# Patient Record
Sex: Female | Born: 1950
Health system: Southern US, Community
[De-identification: ages and names within clinical notes are randomized; demographics above are authoritative.]

## PROBLEM LIST (undated history)

## (undated) ENCOUNTER — Ambulatory Visit: Payer: Medicare Other | Source: Home / Self Care

## (undated) DIAGNOSIS — J309 Allergic rhinitis, unspecified: Secondary | ICD-10-CM

## (undated) DIAGNOSIS — E119 Type 2 diabetes mellitus without complications: Secondary | ICD-10-CM

## (undated) DIAGNOSIS — E785 Hyperlipidemia, unspecified: Secondary | ICD-10-CM

## (undated) DIAGNOSIS — R011 Cardiac murmur, unspecified: Secondary | ICD-10-CM

## (undated) DIAGNOSIS — F329 Major depressive disorder, single episode, unspecified: Secondary | ICD-10-CM

## (undated) DIAGNOSIS — R413 Other amnesia: Secondary | ICD-10-CM

## (undated) DIAGNOSIS — F32A Depression, unspecified: Secondary | ICD-10-CM

## (undated) DIAGNOSIS — J939 Pneumothorax, unspecified: Secondary | ICD-10-CM

## (undated) DIAGNOSIS — K219 Gastro-esophageal reflux disease without esophagitis: Secondary | ICD-10-CM

## (undated) DIAGNOSIS — E669 Obesity, unspecified: Secondary | ICD-10-CM

## (undated) DIAGNOSIS — I10 Essential (primary) hypertension: Secondary | ICD-10-CM

## (undated) DIAGNOSIS — J329 Chronic sinusitis, unspecified: Secondary | ICD-10-CM

## (undated) HISTORY — PX: LEG SURGERY: SHX1003

## (undated) HISTORY — DX: Chronic sinusitis, unspecified: J32.9

## (undated) HISTORY — DX: Other amnesia: R41.3

## (undated) HISTORY — DX: Depression, unspecified: F32.A

## (undated) HISTORY — PX: ABDOMINAL HYSTERECTOMY: SHX81

## (undated) HISTORY — DX: Cardiac murmur, unspecified: R01.1

## (undated) HISTORY — PX: BREAST LUMPECTOMY: SHX2

## (undated) HISTORY — DX: Allergic rhinitis, unspecified: J30.9

## (undated) HISTORY — DX: Gastro-esophageal reflux disease without esophagitis: K21.9

## (undated) HISTORY — DX: Hyperlipidemia, unspecified: E78.5

## (undated) HISTORY — DX: Obesity, unspecified: E66.9

## (undated) HISTORY — PX: HERNIA REPAIR: SHX51

## (undated) HISTORY — PX: TUBAL LIGATION: SHX77

## (undated) HISTORY — DX: Major depressive disorder, single episode, unspecified: F32.9

---

## 1997-08-29 ENCOUNTER — Ambulatory Visit (HOSPITAL_COMMUNITY): Admission: RE | Admit: 1997-08-29 | Discharge: 1997-08-29 | Payer: Self-pay | Admitting: Internal Medicine

## 1997-08-31 ENCOUNTER — Ambulatory Visit (HOSPITAL_COMMUNITY): Admission: RE | Admit: 1997-08-31 | Discharge: 1997-08-31 | Payer: Self-pay | Admitting: *Deleted

## 1999-02-07 ENCOUNTER — Encounter: Payer: Self-pay | Admitting: Internal Medicine

## 1999-02-07 ENCOUNTER — Encounter: Admission: RE | Admit: 1999-02-07 | Discharge: 1999-02-07 | Payer: Self-pay | Admitting: Internal Medicine

## 1999-06-08 ENCOUNTER — Emergency Department (HOSPITAL_COMMUNITY): Admission: EM | Admit: 1999-06-08 | Discharge: 1999-06-08 | Payer: Self-pay | Admitting: Emergency Medicine

## 1999-06-20 ENCOUNTER — Emergency Department (HOSPITAL_COMMUNITY): Admission: EM | Admit: 1999-06-20 | Discharge: 1999-06-20 | Payer: Self-pay | Admitting: Emergency Medicine

## 1999-06-21 ENCOUNTER — Encounter: Payer: Self-pay | Admitting: Emergency Medicine

## 1999-07-10 ENCOUNTER — Encounter: Admission: RE | Admit: 1999-07-10 | Discharge: 1999-07-10 | Payer: Self-pay | Admitting: Internal Medicine

## 1999-07-10 ENCOUNTER — Encounter: Payer: Self-pay | Admitting: Internal Medicine

## 2000-02-11 ENCOUNTER — Ambulatory Visit (HOSPITAL_BASED_OUTPATIENT_CLINIC_OR_DEPARTMENT_OTHER): Admission: RE | Admit: 2000-02-11 | Discharge: 2000-02-11 | Payer: Self-pay | Admitting: Orthopedic Surgery

## 2000-07-13 ENCOUNTER — Other Ambulatory Visit: Admission: RE | Admit: 2000-07-13 | Discharge: 2000-07-13 | Payer: Self-pay | Admitting: *Deleted

## 2001-04-29 ENCOUNTER — Encounter: Payer: Self-pay | Admitting: Urology

## 2001-04-29 ENCOUNTER — Encounter: Admission: RE | Admit: 2001-04-29 | Discharge: 2001-04-29 | Payer: Self-pay | Admitting: Urology

## 2001-05-12 ENCOUNTER — Encounter: Payer: Self-pay | Admitting: Urology

## 2001-05-12 ENCOUNTER — Encounter: Admission: RE | Admit: 2001-05-12 | Discharge: 2001-05-12 | Payer: Self-pay | Admitting: Urology

## 2001-08-18 ENCOUNTER — Inpatient Hospital Stay (HOSPITAL_COMMUNITY): Admission: EM | Admit: 2001-08-18 | Discharge: 2001-08-23 | Payer: Self-pay | Admitting: Cardiovascular Disease

## 2001-08-18 ENCOUNTER — Emergency Department (HOSPITAL_COMMUNITY): Admission: EM | Admit: 2001-08-18 | Discharge: 2001-08-18 | Payer: Self-pay | Admitting: Emergency Medicine

## 2001-08-18 ENCOUNTER — Encounter: Payer: Self-pay | Admitting: Emergency Medicine

## 2001-08-20 ENCOUNTER — Encounter: Payer: Self-pay | Admitting: Cardiovascular Disease

## 2002-03-11 ENCOUNTER — Emergency Department (HOSPITAL_COMMUNITY): Admission: EM | Admit: 2002-03-11 | Discharge: 2002-03-11 | Payer: Self-pay | Admitting: Emergency Medicine

## 2002-06-28 ENCOUNTER — Ambulatory Visit (HOSPITAL_COMMUNITY): Admission: RE | Admit: 2002-06-28 | Discharge: 2002-06-28 | Payer: Self-pay | Admitting: Gastroenterology

## 2002-06-28 ENCOUNTER — Encounter (INDEPENDENT_AMBULATORY_CARE_PROVIDER_SITE_OTHER): Payer: Self-pay | Admitting: Specialist

## 2003-01-29 ENCOUNTER — Ambulatory Visit (HOSPITAL_COMMUNITY): Admission: RE | Admit: 2003-01-29 | Discharge: 2003-01-29 | Payer: Self-pay | Admitting: Internal Medicine

## 2003-04-08 ENCOUNTER — Emergency Department (HOSPITAL_COMMUNITY): Admission: EM | Admit: 2003-04-08 | Discharge: 2003-04-09 | Payer: Self-pay

## 2003-04-17 ENCOUNTER — Inpatient Hospital Stay (HOSPITAL_COMMUNITY): Admission: RE | Admit: 2003-04-17 | Discharge: 2003-04-24 | Payer: Self-pay | Admitting: Internal Medicine

## 2003-04-19 ENCOUNTER — Encounter (INDEPENDENT_AMBULATORY_CARE_PROVIDER_SITE_OTHER): Payer: Self-pay | Admitting: Cardiovascular Disease

## 2003-12-17 ENCOUNTER — Emergency Department (HOSPITAL_COMMUNITY): Admission: EM | Admit: 2003-12-17 | Discharge: 2003-12-17 | Payer: Self-pay | Admitting: Emergency Medicine

## 2003-12-21 ENCOUNTER — Ambulatory Visit (HOSPITAL_BASED_OUTPATIENT_CLINIC_OR_DEPARTMENT_OTHER): Admission: RE | Admit: 2003-12-21 | Discharge: 2003-12-21 | Payer: Self-pay | Admitting: Orthopedic Surgery

## 2004-02-28 ENCOUNTER — Encounter: Admission: RE | Admit: 2004-02-28 | Discharge: 2004-05-28 | Payer: Self-pay | Admitting: Orthopedic Surgery

## 2004-05-29 ENCOUNTER — Encounter: Admission: RE | Admit: 2004-05-29 | Discharge: 2004-06-09 | Payer: Self-pay | Admitting: Orthopedic Surgery

## 2006-07-31 ENCOUNTER — Emergency Department (HOSPITAL_COMMUNITY): Admission: EM | Admit: 2006-07-31 | Discharge: 2006-07-31 | Payer: Self-pay | Admitting: Emergency Medicine

## 2007-10-31 ENCOUNTER — Encounter: Admission: RE | Admit: 2007-10-31 | Discharge: 2007-10-31 | Payer: Self-pay | Admitting: Internal Medicine

## 2010-07-04 NOTE — Discharge Summary (Signed)
NAME:  Kristi Bennett, Kristi Bennett                        ACCOUNT NO.:  [id number]   MEDICAL RECORD NO.:  [id number]                   PATIENT TYPE:  INP   LOCATION:  3733                                 FACILITY:  [facility]   PHYSICIAN:  Eric L. August Saucer, M.D.                  DATE OF BIRTH:  Feb 17, 1950   DATE OF ADMISSION:  04/17/2003  DATE OF DISCHARGE:  04/24/2003                                 DISCHARGE SUMMARY   FINAL DIAGNOSIS:  1. Asthma without status asthmaticus, 493.90.  2. Rhabdomyolysis, 78.88.  3. Hypokalemia, 276.8.  4. Obesity, 278.00.  5. Hypertension, 401.5.  6. Edema, 782.3.  7. Myalgia and myositis, 729.1.  8. Unspecified gastritis and gastroduodenitis, 535.50.  9. Esophagitis, 530.19.  10.      Helicobacter pylori infection, 041.86.   PROCEDURES PERFORMED:  None.   HISTORY OF PRESENT ILLNESS:  This is one of several [facility]  admissions for this 60 year old divorced African-American female who was  doing well until one week ago when she noted problems with increasing asthma  with associated bronchitis. She had recently been on Levaquin antibiotics as  well as nebulizer therapy. She noted only transient improvement. For the  past several days prior to admission she had a relapse with increasing  congestion with cough productive of yellow-greenish phlegm. Over the past  several days prior to admission this had been associated with increasing  lower chest pain, which she described as a more sharp, spastic pain in  nature. This was worse with inspiration. The patient denied any substernal  pressure, pain, or diaphoresis. On the day of admission the patient  developed episodic nausea with vomiting. The patient felt this was more  related to increasing post nasal drainage versus other. Presently, at the  time of admission she was extremely weak with a poor appetite. She had been  treated intensively as an outpatient and subsequently admitted for further  evaluation and  therapy.   PAST MEDICAL HISTORY:  Positive for hypertension, obesity, chest pain with  angina qualities in the past. Previously evaluated by Dr. Algie Coffer. She is  status post hypertension and umbilical hernia, tubal ligation, and status  post tonsillectomy. Cardiac catheterization has never been done.   HISTORY AND PHYSICAL:  See history and physical.   HOSPITAL COURSE:  The patient was admitted for further treatment of acute  asthma, which had become somewhat refractory in nature. She also was  experiencing some nausea and vomiting, which was felt secondary to her  postnasal drainage. She had noted they also became hypovolemic secondary to  above. At the time of admission she was noted to have an elevated CK at 663  with MB 2.0, troponin 0.02. This was felt to be muscular in nature. Her  hyperkalemia was an ongoing concern regarding rhabdo myelosis as well.   The patient was placed on telemetry for close observation. Myocardial  infarction was ruled  out with serial CPKs and troponins. The patient given  IV fluids initially with correction of her electrolytes thereafter. The  patient was also placed on nebulizer therapy as well. Notably the patient  was given Avelox and Avapro at the time of admission. She subsequently  developed significant edema in her legs and involving her hands as well. It  was felt to be an angioedema type reaction. Avelox and Avapro were  discontinued. The patient was started on Augmentin. She was given Solu-  Medrol as well. On subsequent days she did feel considerably better. She was  making some progress. She did continue her dose of Solu-Medrol. Intensive  antibiotics an nebulizers were continued. She did notably have an abnormal  EKG despite a normal catheterization. A 2-D echo was subsequently done and  read by Dr. Debera Lat. She was found to have normal LV function without  effusions. Repeat ANA was weakly positive at 1-40. CK was gradually  decreasing as  well. The patient was continued on extensive respiratory  regimen. She notably did complain of some epigastric discomfort with reflux  symptoms as well. A barium swallow was subsequently ordered. She was found  to have a hiatal hernia with narrowed distal esophagus. A 3 mm barium pill  passed without hold up. She was noted to have spontaneous GE reflux  throughout the exam. The patient had further adjustments of her medication  thereafter. In view of intermittent epigastric discomfort as well as vague  chest discomfort the patient did have a CT scan done, which was negative for  PE. Serologic studies for H. pylori antibody, however, was significantly  positive. It was felt in view of her documented esophagitis and gastritis  she would be an appropriate candidate for treatment of H. pylori infection  with follow-up of her symptoms.   By March 8 she was feeling considerably better. Her lungs were much clearer  with the present regimen. The patient was more ambulatory. No significant  abdominal pain. She was subsequently felt to be stable for discharge with  plans for further evaluation as an outpatient.   DISCHARGE MEDICATIONS:  1. Diovan 160 mg daily.  2. Maxzide 25 mg one-half table daily.  3. Mucinex DM 1 b.i.d.  4. Calcium 500 mg b.i.d.  5. Advair 250/50 one puff b.i.d.  6. Ventolin MDI 1-2 puffs q.4h. p.r.n.  7. Aciphex 20 mg daily.  8. Biaxin 500 mg b.i.d.  9. Amoxicillin 1 g b.i.d. for 14 days.   DISCHARGE INSTRUCTIONS:  She is to rest at home and return to work on April 29, 2003. She will maintain a 4 g sodium, reduced sweets diet. She will be  seen in our office in two weeks time for follow-up.                                                Eric L. August Saucer, M.D.    ELD/MEDQ  D:  06/06/2003  T:  06/08/2003  Job:  [id number]

## 2010-07-04 NOTE — H&P (Signed)
NAME:  Kristi Bennett, Kristi Bennett                        ACCOUNT NO.:  [id number]   MEDICAL RECORD NO.:  [id number]                   PATIENT TYPE:  INP   LOCATION:  3733                                 FACILITY:  [facility]   PHYSICIAN:  Eric L. August Saucer, M.D.                  DATE OF BIRTH:  05-31-1950   DATE OF ADMISSION:  04/17/2003  DATE OF DISCHARGE:                                HISTORY & PHYSICAL   CHIEF COMPLAINT:  Refractory asthma, chest pain, nausea and vomiting.   HISTORY OF PRESENT ILLNESS:  This is one of several [facility]  admissions for this 60 year old, divorced, African-American female who for  the past week has had problems with ongoing asthma with associated  bronchitis.  She had recently been on Levaquin antibiotics as well as  nebulizer therapy.  She noted only a transient improvement.  For the past  several days, she has had a relapse with increasing congestion with cough  productive of yellowish-greenish phlegm.  Over the past several days, this  has been associated with increasing lower chest pain which she described as  more of a sharp, spastic pain in nature.  This was worse with inspiration.  She denied any substernal pressure pain or diaphoresis.  Today, she  developed episodic nausea and vomiting.  She feels this was more related to  increasing post nasal drainage.  Today, she felt extremely weak as well.  Appetite had been poor for the past few days.  Because of no significant  improvement, she was admitted for further evaluation and therapy.   PAST MEDICAL HISTORY:  1. Hypertension.  2. Obesity.  3. Chest pain with anginal like qualities for which she underwent stress     Cardiolite study in 2004, followed by cardiac catheterization per Dr.     Algie Coffer.  There was no significant disease found in her LAD or right     coronary artery area.  She had good LV function at that time.   PAST SURGICAL HISTORY:  1. Status post hysterectomy in 1984.  2. Umbilical  hernia.  3. Tubal ligation.  4. Status post tonsillectomy.  5. Cardiac catheterization as noted.   ALLERGIES:  No known drug allergies.  IBUPROFEN does cause some gastric  irritation.   HABITS:  The patient does not smoke or drink.   SOCIAL HISTORY:  The patient is single and employed by [employer].   CURRENT MEDICATIONS:  1. Diovan HCT 80/12.5 one q.d.  2. Ativan 0.5 mg t.i.d. p.r.n.  3. Albuterol inhaler two puffs q.i.d.  4. Vitamin E 400 units q.d.  5. Calcium 500 mg b.i.d.  6. Magnesium one tablet daily.  7. Estroven one daily.  8. Mucinex DM one b.i.d.  9. Levaquin 500 mg p.o. q.d.   PHYSICAL EXAMINATION:  GENERAL:  She is a well-developed, well-nourished,  African-American female presently in no acute distress.  VITAL SIGNS:  Blood pressure 134/90, pulse 74, respirations 20, temperature  98.1.  HEENT:  Head normocephalic, atraumatic without bruits.  Extraocular  movements intact.  She has mild maxillary sinus tenderness bilaterally.  Fundi without scleral icterus.  TMs with decreased light reflex without  erythematous changes.  Throat with posterior pharynx with mild erythematous  changes.  NECK:  Supple.  No cervical nodes.  LUNGS:  Bilateral rhonchi without wheezing appreciated at this time (the  patient is status post nebulizer treatment).  There is no E to A changes.  Denies CVA tenderness to percussion.  CARDIAC:  Normal S1, S2 with no S3.  A 1/6 systolic ejection murmur in the  lower left sternal border.  There is tenderness to the anterior chest wall  to palpation.  ABDOMEN:  Minimum epigastric tenderness.  No masses appreciated.  EXTREMITIES:  Negative Homans sign with no edema.  Full passive range of  motion in the lower extremities.  NEUROLOGIC:  Alert and oriented x3.  Cranial nerves 2-12 grossly intact.  Cerebellar, sensory and motor functions were intact.   LABORATORY DATA AND X-RAY FINDINGS:  CBC with WBC 7700, hemoglobin 12.8,  hematocrit  38.3, platelets 354,000.  Chemistries with sodium 138, potassium  3.4, chloride 103, CO2 29, BUN 3, creatinine 0.7, glucose 97.  Total protein  7.2, albumin low at 3.1, SGOT 42, SGPT 33, Alk phos 104.  Troponin 0.02, CK  663, CK-MB 2.0.   X-rays are pending at this time.  EKG with normal sinus rhythm with normal  axis.  She has an ST segment depression in the lateral leads.  Nonspecific  ST and T wave changes in inferior leads.  No other acute changes  appreciated.   IMPRESSION:  1. Acute asthma with some refractory nature at this time.  2. Nausea and vomiting secondary to #1 in both nasal drainage.  3. Hypokalemia secondary to #2.  4. Elevated CPK, rule out secondary to rhabdomyolysis, rule out secondary to     ischemia.  She has had a cardiac catheterization less than two years ago.  5. Mild obesity.  6. Hypertension.   PLAN:  1. The patient is admitted for further evaluation.  2. Will place on telemetry with close observation.  Rule out recent     myocardial injury.  3. Correct electrolytes with potassium IV followed by p.o.  4. Start nebulizer therapy with close observation.  5. Avelox empirically at this time.  6. Chest x-ray and abdominal films will be reviewed as well.  7. Further therapy pending response to above.                                                Eric L. August Saucer, M.D.    ELD/MEDQ  D:  04/18/2003  T:  04/18/2003  Job:  [id number]

## 2010-07-04 NOTE — Cardiovascular Report (Signed)
. [facility]  Patient:    Kristi Bennett, Kristi Bennett Visit Number: [id number] MRN: [id number]          Service Type: MED Location: [phone] Attending Physician:  Ricki Rodriguez Dictated by:   Eduardo Osier Sharyn Lull, M.D. Proc. Date: 08/23/01 Admit Date:  08/18/2001 Discharge Date: 08/23/2001   CC:         Minerva Areola L. August Saucer, M.D.  Ricki Rodriguez, M.D.  Cardiac Catheterization Lab   Cardiac Catheterization  PROCEDURE: 1. Left cardiac catheterization. 2. Selective left and right coronary angiography. 3. Left ventriculography via left groin using Judkins technique.  INDICATIONS FOR PROCEDURE:  Ms. Durkin is a 60 year old black female with past medical history significant for hypertension, hypercholesterolemia, morbid obesity plus family history of coronary artery disease.  She was admitted by Dr. Algie Coffer on July 3 because of chest pain described as heaviness associated with shortness of breath with radiation to the left shoulder.  The patient was admitted to the telemetry unit.  MI was ruled out by serial enzymes and EKG. The patient subsequently underwent adenosine Cardiolite.  EKG showed mild nonspecific ST-T wave changes at baseline, and two 1 mm ST depression was noted in inferolateral leads after adenosine infusion was reverted towards the baseline.  Cardiolite scan showed mid to distal anterior wall breast attenuation with questionable small area of ischemia in the same region.  Due to mildly positive adenosine Cardiolite and minor EKG changes and recurrent chest pain.  We discussed with the patient at length regarding findings of Cardiolite study and various options of treatment; i.e., medical versus invasive, possible PTCA and stenting with risks related to MI, stroke, need for emergency CABG, risk of restenosis, local vascular complication, etc., and consented for percutaneous coronary intervention.  PROCEDURE:  After obtaining informed consent,  the patient was brought to the catheterization lab and was placed on fluoroscopy table.  The right and left groins were prepped and draped in usual fashion.  Xylocaine 2% was used for local anesthesia in the right groin.  We attempted to advance the wire via the right femoral approach without success.  Then with the help of a thin-walled needle, a 6-French arterial sheath was placed into the left femoral artery without any problems.  The sheath was aspirated and flushed.  Next, a 6-French left Judkins catheter was advanced over the wire under fluoroscopic guidance up to the ascending aorta.  The wire was pulled out.  The catheter was aspirated and connected to the manifold.  The catheter was further advanced and engaged into the left coronary ostium.  Multiple views of the left system were taken.  Next, the catheter was disengaged and was pulled out over the wire and was replaced with 6-French right Judkins catheter which was advanced over the wire under fluoroscopic guidance up to the ascending aorta.  The wire was pulled out.  The catheter was aspirated and connected to the manifold. The catheter was further advanced and engaged into the right coronary ostium. Multiple views of the right system were taken.  Next the catheter was disengaged and was pulled out over the wire and was replaced with a 6-French pigtail catheter which was advanced over the wire under fluoroscopic guidance up to the ascending aorta.  The wire was pulled out, the catheter was aspirated and connected to the manifold.   The catheter was further advanced across the aortic valve into the LV.  LV pressures were recorded.  Next, the left ventriculography was done  in the 30-degree RAO position. Post-angiographic pressures are recorded from LV, and then pullback pressures were recorded from the aorta.  There was no gradient across the aortic valve. Next, the pigtail catheter was pulled out over the wire.  The sheathes  were aspirated and flushed.  FINDINGS:  LV showed good LV systolic function, EF 55 to 60%.  There was 2+ catheter-induced MR.  The left main was patent.  The LAD has 10% ostial stenosis distally.  The LAD diffusely tapers down at the apex.  There may be some component of spasm.  Diagonal-1 and diagonal-2 were were very small.  Left circumflex was patent which tapers down into A-V groove after giving off OM-3.  OM-1 is very small and is patent.  OM-2 is small which is patent.  Om-3 is also small which is patent.  RCA is patent.  The patient tolerated the procedure well.  There were no complications.  The patient was transferred to recovery room in stable condition. Dictated by:   Eduardo Osier Sharyn Lull, M.D. Attending Physician:  Ricki Rodriguez DD:  08/23/01 TD:  08/25/01 Job: [id number]

## 2010-07-04 NOTE — Op Note (Signed)
[name], Kristi Bennett              ACCOUNT NO.:  [id number]   MEDICAL RECORD NO.:  [id number]          PATIENT TYPE:  AMB   LOCATION:  DSC                          FACILITY:  [facility]   PHYSICIAN:  John L. Rendall III, M.D.DATE OF BIRTH:  19-Feb-1950   DATE OF PROCEDURE:  12/22/2003  DATE OF DISCHARGE:                                 OPERATIVE REPORT   PREOPERATIVE DIAGNOSIS:  Displaced fracture lateral malleolus.   POSTOPERATIVE DIAGNOSIS:  Displaced fracture lateral malleolus.   PROCEDURE:  Open reduction internal fixation lateral malleolar fracture with  a small fragment plate and screws.   SURGEON:  John L. Rendall, M.D.   ANESTHESIA:  General with ankle block.   INDICATIONS AND JUSTIFICATION FOR PROCEDURE:  Displaced lateral malleolar  fracture.   JUSTIFICATION FOR OUTPATIENT SETTING:  In-patient not required.   PATHOLOGY:  The patient slipped and fell on water on the floor while at work  at [employer].  She has a displaced lateral malleolar fracture  with external rotation of the distal fragment and a 2-3 mm separation at the  fracture line.  There is found to be some fascia in the fracture line  blocking straightforward reduction.   DESCRIPTION OF PROCEDURE:  Under general anesthesia, the patient was placed  on the operating table.  The left foot and leg to the knee were prepared with DuraPrep and draped as  a sterile field.  The legs were wrapped out with the Esmarch and the  tourniquet used at 300 mm.  An approximately 4 inch lateral malleolar  incision was made, dissection is carried down to periosteum, which is  exposed.  The fracture site is exposed and there was found to be some  periosteum caught in the fracture.  The distal fragment is grabbed with a  towel clamp and further external rotated, opening up the break.  The  periosteal tissue is removed from the break, fracture is then reduced. It is  clamped in place with a modified towel clip, and a front  to back  interfragmentary screw is placed at a 90 degrees angle to the oblique  fracture.  Once the interfragmentary screw is placed, C-arm pictures  revealed virtually anatomic position on the mortise view.   A semi-tubular Ace plate is then bent to the contour of the lateral  malleolus. Two screws were placed distal to the interfragmentary screw and 3  are placed proximal.  An attempt to drill in the vicinity of the screw hit  dead-on the screw, and there was no additional screw placed in the 6th screw  hole.  C-arm pictures revealed good position and alignment of plate and  screws, with the exception of the front to back screw, which seemed to  protrude posteriorly about 3-4 mm.  It was 30 mm and replaced by a 26 mm  screw.  C-arm pictures then showed perfect position of this screw.  The  wound was then irrigated with saline.  Periosteum was closed over the plate.  The subcutaneous was  closed with 2-0 Vicryl and skin with clips.  Sterile, compression bandage  was  applied with short-leg plaster splint.  Once this was on, the tourniquet  was let down at 36 minutes.  Prior to application of the bandage, the wound  was infiltrated with Marcaine with morphine, with epinephrine.      Daisy Blossom  D:  12/21/2003  T:  12/22/2003  Job:  [id number]

## 2010-07-04 NOTE — Discharge Summary (Signed)
[place]  Patient:    Kristi, Bennett Visit Number: [id number] MRN: [id number]          Service Type: MED Location: [phone] Attending Physician:  Ricki Rodriguez Dictated by:   Eduardo Osier Sharyn Lull, M.D. Admit Date:  08/18/2001 Discharge Date: 08/23/2001   CC:         Ricki Rodriguez, M.D.  Eric L. August Saucer, M.D.   Discharge Summary  ADMISSION DIAGNOSES: 1. Angina, rule out coronary artery disease. 2. Hypertension.  FINAL DIAGNOSES: 1. 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 2. Hypertension. 3. Morbid obesity. 4. Bronchial asthma. 5. Hypercholesterolemia.  DISCHARGE MEDICATIONS: 1. Norvasc 5 mg one tab daily. 2. Enteric coated aspirin 81 mg one tab daily. 3. Protonix 40 mg one tablet daily. 4. Albuterol inhaler as before.  ACTIVITY: Avoid heavy lifting, pushing, or pulling for 48 hours post cardiac catheterization. Instructions have been given.  DIET: Low salt, low cholesterol.  FOLLOW-UP: With Dr. Algie Coffer in seven to ten days and with Dr. August Saucer in two weeks.  DISCHARGE CONDITION: Stable.  HISTORY OF PRESENT ILLNESS: Kristi Bennett is a 60 year old black female with past medical history significant for hypertension, morbid obesity, and history of coronary artery disease who was admitted by Dr. Algie Coffer on August 18, 2001 because of substernal chest pain described as heaviness and associated with shortness of breath. The chest heaviness radiated to the left shoulder.  PAST MEDICAL HISTORY: As above.  PAST SURGICAL HISTORY: She had a hysterectomy in 1986, umbilical hernia repair at age 2, tonsillectomy at age 89. She had a collapsed lung at the age of [age].  ADMISSION MEDICATIONS: 1. Albuterol p.r.n. 2. Zyrtec 10 mg p.o. q.d. 3. HCTZ 25 mg which she ran out of three days ago.  ALLERGIES: None.  SOCIAL HISTORY: She is divorced for ten years. She has two sons. 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  FAMILY HISTORY: Father died at the age of 37. He was hypertensive. Mother is alive. She is 72 years old. She has two brothers.  PHYSICAL EXAMINATION:  VITAL SIGNS: Blood pressure 171/76, pulse 54.  HEENT: Normocephalic, atraumatic. Conjunctiva was pink.  NECK: No JVD.  LUNGS: Clear to auscultation.  HEART: Heart sounds S1 and S2 are normal.  ABDOMEN: Soft.  EXTREMITIES: There is no clubbing, cyanosis, edema.  NEURO: Grossly intact.  LABORATORY DATA: Cholesterol 205, HDL 55, LDL 137, hemoglobin 13.2, hematocrit 38.7, white count 7.6. Two sets of CPK MBs were negative. Troponin I two sets were negative. Sodium 140, potassium 3.7, chloride 107, bicarb 27, glucose 101, BUN 10, creatinine 0.8.  HOSPITAL COURSE: The patient was admitted to telemetry unit. MI was rule out by serial enzymes and EKG. The patient underwent Adenosine Cardiolite on August 20, 2001 which showed mild, up to 1 mm ST depression in the inferolateral leads. Cardiolite scan showed mid to distal anterior wall breast attenuation with small area of ischemia in the mid and distal anterior wall with EF of 67%. Due to recurrent chest pain and mildly positive Adenosine Cardiolite, the patient underwent cardiac catheterization as per procedure report. The patient tolerated the procedure well. There were no complications. The patient did not have any further episodes of chest pain during the hospital stay. Her groin is stable. There is no evidence of hematoma or bruit. The patient has been ambulating in the hallway without any problems. The patient will be discharged to home on above medications  and will be followed by Dr. August Saucer in two weeks and Dr. Algie Coffer in seven to ten days. Dictated by:   Eduardo Osier Sharyn Lull, M.D.  Attending Physician:  Ricki Rodriguez DD:  08/23/01 TD:  08/25/01 Job: [id number]

## 2010-07-04 NOTE — Op Note (Signed)
   NAME:  Kristi Bennett, Kristi Bennett                        ACCOUNT NO.:  [id number]   MEDICAL RECORD NO.:  [id number]                   PATIENT TYPE:  AMB   LOCATION:  ENDO                                 FACILITY:  [facility]   PHYSICIAN:  John C. Madilyn Fireman, M.D.                 DATE OF BIRTH:  08/19/1950   DATE OF PROCEDURE:  06/28/2002  DATE OF DISCHARGE:                                 OPERATIVE REPORT   PROCEDURE:  Colonoscopy with polypectomy.   INDICATIONS FOR PROCEDURE:  Rectal bleeding in a 60 year old patient with no  recent colon screening.   DESCRIPTION OF PROCEDURE:  The patient was placed in the left lateral  decubitus position then placed on the pulse monitor with continuous low flow  oxygen delivered by nasal cannula. She was sedated with 75 mcg IV fentanyl  and 8 mg IV Versed. The Olympus video colonoscope was inserted into the  rectum and advanced to the cecum, confirmed by transillumination at  McBurney's point and visualization of the ileocecal valve and appendiceal  orifice. The prep was excellent. The cecum, ascending, and proximal  transverse colon appeared normal with no masses, polyps, diverticula or  other mucosal abnormalities. Within the distal transverse colon, there was a  7 mm polyp that was fulgurated by hot biopsy. The remainder of the  transverse, descending, sigmoid and rectum appeared normal without any  further polyps, masses, diverticula or other mucosal abnormalities down to  the anus where retroflexed view did reveal some small internal hemorrhoids.  The scope was then withdrawn and the patient returned to the recovery room  in stable condition. The patient tolerated the procedure well and there were  no immediate complications.   IMPRESSION:  Small transverse colon polyp, otherwise, normal study except  for small hemorrhoids.   PLAN:  Await histology to determine method and interval for future colon  screening.       John C. Madilyn Fireman, M.D.    [facility]/MEDQ  D:  06/28/2002  T:  06/29/2002  Job:  [id number]   cc:   Minerva Areola L. August Saucer, M.D.  [address] [address]  [place]  [place] [address]  Fax: [phone]

## 2011-04-28 ENCOUNTER — Encounter (HOSPITAL_COMMUNITY): Payer: Self-pay | Admitting: Emergency Medicine

## 2011-04-28 ENCOUNTER — Emergency Department (HOSPITAL_COMMUNITY)
Admission: EM | Admit: 2011-04-28 | Discharge: 2011-04-28 | Disposition: A | Discharge status: Home / Self Care | Payer: Self-pay | Attending: Emergency Medicine | Admitting: Emergency Medicine

## 2011-04-28 DIAGNOSIS — J3489 Other specified disorders of nose and nasal sinuses: Secondary | ICD-10-CM | POA: Insufficient documentation

## 2011-04-28 DIAGNOSIS — I10 Essential (primary) hypertension: Secondary | ICD-10-CM | POA: Insufficient documentation

## 2011-04-28 DIAGNOSIS — J0100 Acute maxillary sinusitis, unspecified: Secondary | ICD-10-CM

## 2011-04-28 DIAGNOSIS — R51 Headache: Secondary | ICD-10-CM | POA: Insufficient documentation

## 2011-04-28 DIAGNOSIS — J069 Acute upper respiratory infection, unspecified: Secondary | ICD-10-CM

## 2011-04-28 DIAGNOSIS — Z79899 Other long term (current) drug therapy: Secondary | ICD-10-CM | POA: Insufficient documentation

## 2011-04-28 HISTORY — DX: Essential (primary) hypertension: I10

## 2011-04-28 MED ORDER — AMOXICILLIN 250 MG PO CAPS: 250.0000 mg | ORAL_CAPSULE | Freq: Two times a day (BID) | ORAL | Status: AC

## 2011-04-28 NOTE — ED Provider Notes (Signed)
History     CSN: [id number]  Arrival date & time 04/28/11  1933   First MD Initiated Contact with Patient 04/28/11 2058      Chief Complaint  Patient presents with  . Headache    (Consider location/radiation/quality/duration/timing/severity/associated sxs/prior treatment) HPI  61 year old female presents with cold symptoms. Patient for the past 3-4 days she has been having a gradual headache. Headaches affecting her right frontal sinus. She has runny nose and nasal congestion. She had ear pain, sneezing, cough productive with white sputum, throat irritation, and generalized weakness. Patient felt a pressure in her head.  Pt has some neck pain but denies neck stiffness.  Endorse dizziness, generalized fatigue, and having nausea without vomiting or diarrhea.  Denies cp, sob, or abd pain.  Denies dysurea.  Has tried OTC nasal saline without relief.  Notice bloody snot from one nostril today but denies any other bleeding.    Past Medical History  Diagnosis Date  . Asthma   . Hypertension     Past Surgical History  Procedure Date  . Tubal ligation   . Breast lumpectomy   . Hernia repair   . Leg surgery     Family History  Problem Relation Age of Onset  . Hypertension Other   . Coronary artery disease Other     History  Substance Use Topics  . Smoking status: Never Smoker   . Smokeless tobacco: Not on file  . Alcohol Use: Yes     rare    OB History    Grav Para Term Preterm Abortions TAB SAB Ect Mult Living                  Review of Systems  All other systems reviewed and are negative.    Allergies  Excedrin extra strength  Home Medications   Current Outpatient Rx  Name Route Sig Dispense Refill  . ASPIRIN 81 MG PO TABS Oral Take 81 mg by mouth daily.    Marland Kitchen CETIRIZINE HCL 10 MG PO TABS Oral Take 10 mg by mouth daily.    Marland Kitchen CITALOPRAM HYDROBROMIDE 20 MG PO TABS Oral Take 20 mg by mouth daily.    Marland Kitchen LOSARTAN POTASSIUM PO Oral Take 1 tablet by mouth daily.      Marland Kitchen LIVALO PO Oral Take 1 tablet by mouth daily.    . TRIAMTERENE-HCTZ 37.5-25 MG PO TABS Oral Take 1 tablet by mouth daily.      BP 143/69  Pulse 65  Temp(Src) 98.2 F (36.8 C) (Oral)  Resp 20  SpO2 100%  Physical Exam  Nursing note and vitals reviewed. Constitutional: She appears well-developed and well-nourished. No distress.         Awake, alert, nontoxic appearance  HENT:  Head: Atraumatic.  Right Ear: A middle ear effusion is present.  Left Ear: A middle ear effusion is present.  Nose: Mucosal edema and rhinorrhea present.  Mouth/Throat: Uvula is midline, oropharynx is clear and moist and mucous membranes are normal.  Eyes: Conjunctivae are normal. Right eye exhibits no discharge. Left eye exhibits no discharge.  Neck: Normal range of motion. Neck supple. No rigidity. No Brudzinski's sign and no Kernig's sign noted.  Cardiovascular: Normal rate and regular rhythm.   Pulmonary/Chest: Effort normal. No respiratory distress. She exhibits no tenderness.  Abdominal: Soft. There is no tenderness. There is no rebound.  Musculoskeletal: She exhibits no tenderness.       ROM appears intact, no obvious focal weakness  Neurological:  Mental status and motor strength appears intact  Skin: No rash noted.  Psychiatric: She has a normal mood and affect.    ED Course  Procedures (including critical care time)  Labs Reviewed - No data to display No results found.   No diagnosis found.    MDM  Symptoms is suggestive of an upper respiratory infection. No meningismal signs. No active epistasis. Due to duration of sinus discomfort, patient will be prescribed antibiotic for sinusitis. Patient voiced understanding.     9:40 PM Pt sts she can tolerates amoxicillin in the past without side effect     Fayrene Helper, PA-C 04/28/11 2138  Fayrene Helper, PA-C 04/28/11 2140

## 2011-04-28 NOTE — ED Notes (Signed)
Pt states she has not felt well for the past 3 to 4 days  Pt states it started with a mild headache and congestion   Pt states today on her way to work she blew her nose and a big clot of blood came out  Pt states all her pain is on the right side  Pt states her right ear hurts, eye hurts and has been jumping, the right side of her neck hurts with a pressure felt there  Pt states today she has been having dizziness, feeling weak, and having nausea

## 2011-04-28 NOTE — Discharge Instructions (Signed)
Sinusitis Sinuses are air pockets within the bones of your face. The growth of bacteria within a sinus leads to infection. The infection prevents the sinuses from draining. This infection is called sinusitis. SYMPTOMS  There will be different areas of pain depending on which sinuses have become infected.  The maxillary sinuses often produce pain beneath the eyes.   Frontal sinusitis may cause pain in the middle of the forehead and above the eyes.  Other problems (symptoms) include:  Toothaches.   Colored, pus-like (purulent) drainage from the nose.   Swelling, warmth, and tenderness over the sinus areas may be signs of infection.  TREATMENT  Sinusitis is most often determined by an exam.X-rays may be taken. If x-rays have been taken, make sure you obtain your results or find out how you are to obtain them. Your caregiver may give you medications (antibiotics). These are medications that will help kill the bacteria causing the infection. You may also be given a medication (decongestant) that helps to reduce sinus swelling.  HOME CARE INSTRUCTIONS   Only take over-the-counter or prescription medicines for pain, discomfort, or fever as directed by your caregiver.   Drink extra fluids. Fluids help thin the mucus so your sinuses can drain more easily.   Applying either moist heat or ice packs to the sinus areas may help relieve discomfort.   Use saline nasal sprays to help moisten your sinuses. The sprays can be found at your local drugstore.  SEEK IMMEDIATE MEDICAL CARE IF:  You have a fever.   You have increasing pain, severe headaches, or toothache.   You have nausea, vomiting, or drowsiness.   You develop unusual swelling around the face or trouble seeing.  MAKE SURE YOU:   Understand these instructions.   Will watch your condition.   Will get help right away if you are not doing well or get worse.  Document Released: 02/02/2005 Document Revised: 01/22/2011 Document Reviewed:  09/01/2006 [facility] Patient Information 2012 [place], [place].Upper Respiratory Infection, Adult An upper respiratory infection (URI) is also sometimes known as the common cold. The upper respiratory tract includes the nose, sinuses, throat, trachea, and bronchi. Bronchi are the airways leading to the lungs. Most people improve within 1 week, but symptoms can last up to 2 weeks. A residual cough may last even longer.  CAUSES Many different viruses can infect the tissues lining the upper respiratory tract. The tissues become irritated and inflamed and often become very moist. Mucus production is also common. A cold is contagious. You can easily spread the virus to others by oral contact. This includes kissing, sharing a glass, coughing, or sneezing. Touching your mouth or nose and then touching a surface, which is then touched by another person, can also spread the virus. SYMPTOMS  Symptoms typically develop 1 to 3 days after you come in contact with a cold virus. Symptoms vary from person to person. They may include:  Runny nose.   Sneezing.   Nasal congestion.   Sinus irritation.   Sore throat.   Loss of voice (laryngitis).   Cough.   Fatigue.   Muscle aches.   Loss of appetite.   Headache.   Low-grade fever.  DIAGNOSIS  You might diagnose your own cold based on familiar symptoms, since most people get a cold 2 to 3 times a year. Your caregiver can confirm this based on your exam. Most importantly, your caregiver can check that your symptoms are not due to another disease such as strep throat, sinusitis, pneumonia,  asthma, or epiglottitis. Blood tests, throat tests, and X-rays are not necessary to diagnose a common cold, but they may sometimes be helpful in excluding other more serious diseases. Your caregiver will decide if any further tests are required. RISKS AND COMPLICATIONS  You may be at risk for a more severe case of the common cold if you smoke cigarettes, have chronic  heart disease (such as heart failure) or lung disease (such as asthma), or if you have a weakened immune system. The very young and very old are also at risk for more serious infections. Bacterial sinusitis, middle ear infections, and bacterial pneumonia can complicate the common cold. The common cold can worsen asthma and chronic obstructive pulmonary disease (COPD). Sometimes, these complications can require emergency medical care and may be life-threatening. PREVENTION  The best way to protect against getting a cold is to practice good hygiene. Avoid oral or hand contact with people with cold symptoms. Wash your hands often if contact occurs. There is no clear evidence that vitamin C, vitamin E, echinacea, or exercise reduces the chance of developing a cold. However, it is always recommended to get plenty of rest and practice good nutrition. TREATMENT  Treatment is directed at relieving symptoms. There is no cure. Antibiotics are not effective, because the infection is caused by a virus, not by bacteria. Treatment may include:  Increased fluid intake. Sports drinks offer valuable electrolytes, sugars, and fluids.   Breathing heated mist or steam (vaporizer or shower).   Eating chicken soup or other clear broths, and maintaining good nutrition.   Getting plenty of rest.   Using gargles or lozenges for comfort.   Controlling fevers with ibuprofen or acetaminophen as directed by your caregiver.   Increasing usage of your inhaler if you have asthma.  Zinc gel and zinc lozenges, taken in the first 24 hours of the common cold, can shorten the duration and lessen the severity of symptoms. Pain medicines may help with fever, muscle aches, and throat pain. A variety of non-prescription medicines are available to treat congestion and runny nose. Your caregiver can make recommendations and may suggest nasal or lung inhalers for other symptoms.  HOME CARE INSTRUCTIONS   Only take over-the-counter or  prescription medicines for pain, discomfort, or fever as directed by your caregiver.   Use a warm mist humidifier or inhale steam from a shower to increase air moisture. This may keep secretions moist and make it easier to breathe.   Drink enough water and fluids to keep your urine clear or pale yellow.   Rest as needed.   Return to work when your temperature has returned to normal or as your caregiver advises. You may need to stay home longer to avoid infecting others. You can also use a face mask and careful hand washing to prevent spread of the virus.  SEEK MEDICAL CARE IF:   After the first few days, you feel you are getting worse rather than better.   You need your caregiver's advice about medicines to control symptoms.   You develop chills, worsening shortness of breath, or brown or red sputum. These may be signs of pneumonia.   You develop yellow or brown nasal discharge or pain in the face, especially when you bend forward. These may be signs of sinusitis.   You develop a fever, swollen neck glands, pain with swallowing, or white areas in the back of your throat. These may be signs of strep throat.  SEEK IMMEDIATE MEDICAL CARE IF:  You have a fever.   You develop severe or persistent headache, ear pain, sinus pain, or chest pain.   You develop wheezing, a prolonged cough, cough up blood, or have a change in your usual mucus (if you have chronic lung disease).   You develop sore muscles or a stiff neck.  Document Released: 07/29/2000 Document Revised: 01/22/2011 Document Reviewed: 06/06/2010 [facility] Patient Information 2012 [place], [place].Upper Respiratory Infection, Adult An upper respiratory infection (URI) is also sometimes known as the common cold. The upper respiratory tract includes the nose, sinuses, throat, trachea, and bronchi. Bronchi are the airways leading to the lungs. Most people improve within 1 week, but symptoms can last up to 2 weeks. A residual cough may  last even longer.  CAUSES Many different viruses can infect the tissues lining the upper respiratory tract. The tissues become irritated and inflamed and often become very moist. Mucus production is also common. A cold is contagious. You can easily spread the virus to others by oral contact. This includes kissing, sharing a glass, coughing, or sneezing. Touching your mouth or nose and then touching a surface, which is then touched by another person, can also spread the virus. SYMPTOMS  Symptoms typically develop 1 to 3 days after you come in contact with a cold virus. Symptoms vary from person to person. They may include:  Runny nose.   Sneezing.   Nasal congestion.   Sinus irritation.   Sore throat.   Loss of voice (laryngitis).   Cough.   Fatigue.   Muscle aches.   Loss of appetite.   Headache.   Low-grade fever.  DIAGNOSIS  You might diagnose your own cold based on familiar symptoms, since most people get a cold 2 to 3 times a year. Your caregiver can confirm this based on your exam. Most importantly, your caregiver can check that your symptoms are not due to another disease such as strep throat, sinusitis, pneumonia, asthma, or epiglottitis. Blood tests, throat tests, and X-rays are not necessary to diagnose a common cold, but they may sometimes be helpful in excluding other more serious diseases. Your caregiver will decide if any further tests are required. RISKS AND COMPLICATIONS  You may be at risk for a more severe case of the common cold if you smoke cigarettes, have chronic heart disease (such as heart failure) or lung disease (such as asthma), or if you have a weakened immune system. The very young and very old are also at risk for more serious infections. Bacterial sinusitis, middle ear infections, and bacterial pneumonia can complicate the common cold. The common cold can worsen asthma and chronic obstructive pulmonary disease (COPD). Sometimes, these complications can  require emergency medical care and may be life-threatening. PREVENTION  The best way to protect against getting a cold is to practice good hygiene. Avoid oral or hand contact with people with cold symptoms. Wash your hands often if contact occurs. There is no clear evidence that vitamin C, vitamin E, echinacea, or exercise reduces the chance of developing a cold. However, it is always recommended to get plenty of rest and practice good nutrition. TREATMENT  Treatment is directed at relieving symptoms. There is no cure. Antibiotics are not effective, because the infection is caused by a virus, not by bacteria. Treatment may include:  Increased fluid intake. Sports drinks offer valuable electrolytes, sugars, and fluids.   Breathing heated mist or steam (vaporizer or shower).   Eating chicken soup or other clear broths, and maintaining good nutrition.  Getting plenty of rest.   Using gargles or lozenges for comfort.   Controlling fevers with ibuprofen or acetaminophen as directed by your caregiver.   Increasing usage of your inhaler if you have asthma.  Zinc gel and zinc lozenges, taken in the first 24 hours of the common cold, can shorten the duration and lessen the severity of symptoms. Pain medicines may help with fever, muscle aches, and throat pain. A variety of non-prescription medicines are available to treat congestion and runny nose. Your caregiver can make recommendations and may suggest nasal or lung inhalers for other symptoms.  HOME CARE INSTRUCTIONS   Only take over-the-counter or prescription medicines for pain, discomfort, or fever as directed by your caregiver.   Use a warm mist humidifier or inhale steam from a shower to increase air moisture. This may keep secretions moist and make it easier to breathe.   Drink enough water and fluids to keep your urine clear or pale yellow.   Rest as needed.   Return to work when your temperature has returned to normal or as your  caregiver advises. You may need to stay home longer to avoid infecting others. You can also use a face mask and careful hand washing to prevent spread of the virus.  SEEK MEDICAL CARE IF:   After the first few days, you feel you are getting worse rather than better.   You need your caregiver's advice about medicines to control symptoms.   You develop chills, worsening shortness of breath, or brown or red sputum. These may be signs of pneumonia.   You develop yellow or brown nasal discharge or pain in the face, especially when you bend forward. These may be signs of sinusitis.   You develop a fever, swollen neck glands, pain with swallowing, or white areas in the back of your throat. These may be signs of strep throat.  SEEK IMMEDIATE MEDICAL CARE IF:   You have a fever.   You develop severe or persistent headache, ear pain, sinus pain, or chest pain.   You develop wheezing, a prolonged cough, cough up blood, or have a change in your usual mucus (if you have chronic lung disease).   You develop sore muscles or a stiff neck.  Document Released: 07/29/2000 Document Revised: 01/22/2011 Document Reviewed: 06/06/2010 [facility] Patient Information 2012 [place], [place].

## 2011-04-29 NOTE — ED Provider Notes (Signed)
Medical screening examination/treatment/procedure(s) were performed by non-physician practitioner and as supervising physician I was immediately available for consultation/collaboration.  Samyria Rudie, MD 04/29/11 0957 

## 2012-06-27 ENCOUNTER — Encounter: Payer: Self-pay | Admitting: Hematology

## 2012-10-07 ENCOUNTER — Ambulatory Visit (INDEPENDENT_AMBULATORY_CARE_PROVIDER_SITE_OTHER): Payer: BC Managed Care – PPO | Admitting: Neurology

## 2012-10-07 ENCOUNTER — Encounter: Payer: Self-pay | Admitting: Neurology

## 2012-10-07 VITALS — BP 130/70 | HR 76 | Temp 98.2°F | Ht 63.5 in | Wt 229.0 lb

## 2012-10-07 DIAGNOSIS — D329 Benign neoplasm of meninges, unspecified: Secondary | ICD-10-CM

## 2012-10-07 DIAGNOSIS — D320 Benign neoplasm of cerebral meninges: Secondary | ICD-10-CM

## 2012-10-07 DIAGNOSIS — G4440 Drug-induced headache, not elsewhere classified, not intractable: Secondary | ICD-10-CM

## 2012-10-07 NOTE — Progress Notes (Signed)
NEUROLOGY CONSULTATION NOTE  SORAIYA AHNER MRN: [id number] DOB: 1950/07/15   Referring provider: Dr. August Saucer Primary care provider: Dr. August Saucer  Reason for consult:  Head pain and abnormal MRI  HISTORY OF PRESENT ILLNESS: Kristi Bennett is a 62 year old woman with history of allergic rhinitis, and HTN who presents for evaluation of MRI results and right-sided head pain.  She is accompanied by her daughter..  Records and images were personally reviewed where available.  She had similar problems in 2001.  CT scan of the head performed on 06/21/99 showed ethmoid sinusitis.  At that time she had right head pain, which resolved with antibiotics.  Over the past year or so, she has had recurrent episodes of right-sided head pressure, associated with nasal discharge.  It is not pain, but rather a pressure, involving the entire right side of her head, as well as around the eye and the ear.  It is not positional.  Besides fullness of the ear, she has a ringing in the ear as well.  She does not exhibit visual obscurations, but notes increased blurred vision only in the right eye.  Occasionally, she feels nauseous, but the most prominent symptoms is the nasal drainage.  No vomiting, photophobia or phonophobia.  No focal facial numbness or vertigo. She is typically treated with antibiotics and nasal steroid sprays about every 3 months for recurrent head pressure, which usually resolves but then returns.  For the last several months, the head pressure has been constant.  She takes ibuprofen daily for at least a year.  She also notes blurred vision only involving the right eye.  She underwent an MRI of the Brain w/o contrast on 08/30/12.  It is overall unremarkable and did not show any acute intracranial abnormalities.  Minimal chronic microvascular white matter changes were seen.  However, it did show a small extra-axial dural-based lesion, 0.5 x 0.9 cm, overlying the right mid-parietal lobe, without mass effect or  edema.  Mild ethmoid sinus mucosal thickening.  During the scan, she noted that her body began to tremble and she felt a pop on the right side of her head and facial area, as well as increased postnasal drainage.  She began to cough and felt nauseous.  Following that, she had intermittent blood from postnasal drainange.     She denies history of migraines or headaches, except for episode in 2001.  Labs 08/18/12 including CBC and CMP included fasting glucose 156, Hgb A1c 6.6, TSH 1.560, LDL 171.Marland Kitchen  PAST MEDICAL HISTORY: Past Medical History  Diagnosis Date  . Asthma   . Hypertension   . Allergic rhinitis   . Sinusitis   . Hyperlipidemia   . Obesity   . GERD (gastroesophageal reflux disease)   . Depression   . Memory impairment   . Heart murmur     PAST SURGICAL HISTORY: Past Surgical History  Procedure Laterality Date  . Tubal ligation    . Breast lumpectomy    . Hernia repair    . Leg surgery    . Abdominal hysterectomy      MEDICATIONS: Current Outpatient Prescriptions on File Prior to Visit  Medication Sig Dispense Refill  . aspirin 81 MG tablet Take 81 mg by mouth daily.      . cetirizine (ZYRTEC) 10 MG tablet Take 10 mg by mouth daily.      . citalopram (CELEXA) 20 MG tablet Take 20 mg by mouth daily.      . Escitalopram Oxalate (  LEXAPRO PO) Take by mouth.      Marland Kitchen LOSARTAN POTASSIUM PO Take 1 tablet by mouth daily.      . Pitavastatin Calcium (LIVALO PO) Take 2 mg by mouth daily.       Marland Kitchen triamterene-hydrochlorothiazide (MAXZIDE-25) 37.5-25 MG per tablet Take 1 tablet by mouth daily.       No current facility-administered medications on file prior to visit.    ALLERGIES: Allergies  Allergen Reactions  . Aspirin-Acetaminophen-Caffeine Anaphylaxis  . Avapro [Irbesartan]   . Avelox [Moxifloxacin]   . Lipitor [Atorvastatin]     FAMILY HISTORY: Family History  Problem Relation Age of Onset  . Hypertension Other   . Coronary artery disease Other     SOCIAL  HISTORY: History   Social History  . Marital Status: Divorced    Spouse Name: N/A    Number of Children: N/A  . Years of Education: N/A   Occupational History  . Not on file.   Social History Main Topics  . Smoking status: Never Smoker   . Smokeless tobacco: Never Used  . Alcohol Use: Yes     Comment: glass of wine once or twice a month  . Drug Use: No  . Sexual Activity: Not on file   Other Topics Concern  . Not on file   Social History Narrative  . No narrative on file    REVIEW OF SYSTEMS: Constitutional: No fevers, chills, or sweats, no generalized fatigue, change in appetite Eyes: No visual changes, double vision, eye pain Ear, nose and throat: No hearing loss, ear pain, nasal congestion, sore throat Cardiovascular: No chest pain, palpitations Respiratory:  No shortness of breath at rest or with exertion, wheezes GastrointestinaI: No nausea, vomiting, diarrhea, abdominal pain, fecal incontinence Genitourinary:  No dysuria, urinary retention or frequency Musculoskeletal:  No neck pain, back pain Integumentary: No rash, pruritus, skin lesions Neurological: as above Psychiatric: No depression, insomnia, anxiety Endocrine: No palpitations, fatigue, diaphoresis, mood swings, change in appetite, change in weight, increased thirst Hematologic/Lymphatic:  No anemia, purpura, petechiae. Allergic/Immunologic: no itchy/runny eyes, nasal congestion, recent allergic reactions, rashes  PHYSICAL EXAM: Filed Vitals:   10/07/12 1513  BP: 130/70  Pulse: 76  Temp: 98.2 F (36.8 C)   General: No acute distress Head:  Normocephalic/atraumatic Neck: supple, no paraspinal tenderness, full range of motion Back: No paraspinal tenderness Heart: regular rate and rhythm Lungs: Clear to auscultation bilaterally. Vascular: No carotid bruits. Neurological Exam: Mental status: alert and oriented to person, place, and time, speech fluent and not dysarthric, language intact. Cranial  nerves: CN I: not tested CN II: pupils equal, round and reactive to light, visual fields intact, fundi unremarkable. CN III, IV, VI:  full range of motion, no nystagmus, no ptosis CN V: facial sensation intact CN VII: upper and lower face symmetric CN VIII: hearing intact CN IX, X: gag intact, uvula midline CN XI: sternocleidomastoid and trapezius muscles intact CN XII: tongue midline Bulk & Tone: normal, no fasciculations. Motor: 5/5 throughout Sensation: temperature and vibration intact Deep Tendon Reflexes: 2+ throughout, toes down Finger to nose testing: normal Gait: normal stance and stride, able to walk in tandem. Romberg negative.  IMPRESSION & PLAN: LERA GAINES is a 62 y.o. female with right-sided head pressure and abnormal MRI.    1.  Head-pressure is likely rebound/medication-overuse effect from daily use of ibuprofen and frequent use of nasal sprays and antibiotics.  - Recommend slowly tapering down use of ibuprofen to no more than 3 days  out of the week.  - Limit use of nasal steroid sprays  - referral to ophthalmology regarding right monocular blurred vision. 2.  MRI reveals likely meningioma.  This is an incidental finding and is typically benign.  - As per radiologist's recommendation, we will order another MRI of the brain, but only WITH contrast in order to better evaluate this lesion.  - Would recommend repeating MRI of brain in 6 to 12 months to ensure stability 3.  Follow up in 3 months.  45 minutes spent with patient and daughter, over 50% spent reviewing images with patient, counseling and coordinating care.  Thank you for allowing me to take part in the care of this patient.  Shon Millet, DO  CC:  Willey Blade, MD

## 2012-10-07 NOTE — Patient Instructions (Addendum)
Your MRI looks like you have a meningioma.  This is usually a benign tumor and usually an incidental finding.  It is not contributing to head pressure and blurred vision.  I think your head pressure is likely due to rebound/medication-overuse from the daily ibuprofen and regular antibiotics and nasal sprays that you use. 1.  As per the radiologist's recommendation, we will check another MRI of the brain, but with contrast. 2.  I would recommend slowly tapering off the daily use of ibuprofen.  - Use ibuprofen no more than 6 days out of the week for 1 wk  - Then take no more than 5 days out of the week for 1 week  - Then no more than 4 days out of the week for 1 week  - Then no more than 3 days out of the week 3.  Would likely repeat MRI of brain in 6 to 12 months to ensure stability 4.  Recommend ophthalmology to evaluate eyes. 5.  Follow up in 3 months.  Your MRI is scheduled at [facility] on Thursday, August 28th at 3:00 pm. Please check in at the first floor radiology department 15 minutes prior to your scheduled appointment time. Enter the hospital at Entrance A off of [address].     [phone].   Your eye exam is scheduled at [facility] on Monday, September 15th at 11:30 AM.  Their address is [address], [place], [place] [address]  [phone].

## 2012-10-13 ENCOUNTER — Ambulatory Visit (HOSPITAL_COMMUNITY): Payer: BC Managed Care – PPO

## 2012-10-21 ENCOUNTER — Other Ambulatory Visit: Payer: Self-pay | Admitting: Neurology

## 2012-10-24 ENCOUNTER — Inpatient Hospital Stay
Admission: RE | Admit: 2012-10-24 | Discharge: 2012-10-24 | Disposition: A | Discharge status: Home / Self Care | Payer: Self-pay | Source: Ambulatory Visit | Attending: Radiology | Admitting: Radiology

## 2012-10-24 ENCOUNTER — Other Ambulatory Visit (HOSPITAL_COMMUNITY): Payer: Self-pay | Admitting: Radiology

## 2012-10-24 ENCOUNTER — Ambulatory Visit (HOSPITAL_COMMUNITY)
Admission: RE | Admit: 2012-10-24 | Discharge: 2012-10-24 | Disposition: A | Discharge status: Home / Self Care | Payer: BC Managed Care – PPO | Source: Ambulatory Visit | Attending: Neurology | Admitting: Neurology

## 2012-10-24 DIAGNOSIS — R52 Pain, unspecified: Secondary | ICD-10-CM

## 2012-10-24 DIAGNOSIS — D329 Benign neoplasm of meninges, unspecified: Secondary | ICD-10-CM

## 2012-10-24 DIAGNOSIS — D320 Benign neoplasm of cerebral meninges: Secondary | ICD-10-CM | POA: Insufficient documentation

## 2012-10-24 LAB — CREATININE, SERUM
Creatinine, Ser: 0.62 mg/dL (ref 0.50–1.10)
GFR calc Af Amer: >90 mL/min (ref >90)
GFR calc non Af Amer: >90 mL/min (ref >90)

## 2012-10-24 MED ORDER — GADOBENATE DIMEGLUMINE 529 MG/ML IV SOLN
20.0000 mL | Freq: Once | INTRAVENOUS | Status: AC
  Administered 2012-10-24: 20 mL via INTRAVENOUS

## 2012-10-25 ENCOUNTER — Telehealth: Payer: Self-pay | Admitting: Neurology

## 2012-10-25 NOTE — Telephone Encounter (Signed)
Message copied by Benay Spice on Tue Oct 25, 2012  9:46 AM ------      Message from: JAFFE, ADAM R      Created: Tue Oct 25, 2012  5:30 AM       Please let Ms. Apgar know that the MRI results is consistent with a meningioma, which is likely benign and incidental.      ----- Message -----         From: Rad Results In Interface         Sent: 10/24/2012   2:27 PM           To: Cira Servant, DO                   ------

## 2012-10-25 NOTE — Telephone Encounter (Signed)
Left a message for the patient to return my call.  

## 2012-10-27 NOTE — Telephone Encounter (Signed)
Left a message on the patient's cell number to return my call.

## 2012-10-27 NOTE — Telephone Encounter (Signed)
Spoke with the patient. Information given as per Dr. Everlena Cooper below. The patient voiced concern over the fact that she doesn't feel confident that this isn't "something." She wonders how you can be sure that this meningioma isn't something serious? "No one wants something growing in their head." I told her I would pass on her concerns to Dr, Everlena Cooper and see if perhaps he could offer her xome reassurance. She is ok with this plan. She is aware he is out of the office this afternoon and is fine to wait. **Dr. Everlena Cooper, please advise.

## 2012-10-28 NOTE — Telephone Encounter (Signed)
Spoke with the patient. Information given as per Dr. Everlena Cooper below. The patient states she feels better.

## 2012-10-28 NOTE — Telephone Encounter (Signed)
Meningiomas are usually benign and are typically stable and over time do not really grow much, if at all.  But to make sure, that is why I recommended a repeat MRI in 6 to 12 months, to ensure it is stable.  I believe it is an incidental finding and not contributing to symptoms of her head pressure.

## 2012-11-29 ENCOUNTER — Other Ambulatory Visit: Payer: Self-pay | Admitting: *Deleted

## 2012-11-29 ENCOUNTER — Ambulatory Visit
Admission: RE | Admit: 2012-11-29 | Discharge: 2012-11-29 | Disposition: A | Discharge status: Home / Self Care | Payer: BC Managed Care – PPO | Source: Ambulatory Visit | Attending: *Deleted | Admitting: *Deleted

## 2012-11-29 DIAGNOSIS — J329 Chronic sinusitis, unspecified: Secondary | ICD-10-CM

## 2013-01-04 ENCOUNTER — Telehealth: Payer: Self-pay | Admitting: Neurology

## 2013-01-04 NOTE — Telephone Encounter (Signed)
Pt called to canc 3 month follow up w/ Dr. Everlena Cooper. Says she will call back sometime in Dec. to r/s / Sherri

## 2013-01-06 ENCOUNTER — Ambulatory Visit: Payer: BC Managed Care – PPO | Admitting: Neurology

## 2013-07-24 ENCOUNTER — Observation Stay (HOSPITAL_COMMUNITY)
Admission: EM | Admit: 2013-07-24 | Discharge: 2013-07-25 | Disposition: A | Discharge status: Home / Self Care | Payer: BC Managed Care – PPO | Attending: Family Medicine | Admitting: Family Medicine

## 2013-07-24 ENCOUNTER — Emergency Department (HOSPITAL_COMMUNITY): Payer: BC Managed Care – PPO

## 2013-07-24 ENCOUNTER — Encounter (HOSPITAL_COMMUNITY): Payer: Self-pay | Admitting: Emergency Medicine

## 2013-07-24 DIAGNOSIS — E119 Type 2 diabetes mellitus without complications: Secondary | ICD-10-CM | POA: Diagnosis present

## 2013-07-24 DIAGNOSIS — R0789 Other chest pain: Principal | ICD-10-CM | POA: Diagnosis present

## 2013-07-24 DIAGNOSIS — R5381 Other malaise: Secondary | ICD-10-CM | POA: Insufficient documentation

## 2013-07-24 DIAGNOSIS — K219 Gastro-esophageal reflux disease without esophagitis: Secondary | ICD-10-CM | POA: Diagnosis present

## 2013-07-24 DIAGNOSIS — R5383 Other fatigue: Secondary | ICD-10-CM

## 2013-07-24 DIAGNOSIS — E669 Obesity, unspecified: Secondary | ICD-10-CM | POA: Diagnosis present

## 2013-07-24 DIAGNOSIS — E785 Hyperlipidemia, unspecified: Secondary | ICD-10-CM | POA: Diagnosis present

## 2013-07-24 DIAGNOSIS — J309 Allergic rhinitis, unspecified: Secondary | ICD-10-CM | POA: Insufficient documentation

## 2013-07-24 DIAGNOSIS — R011 Cardiac murmur, unspecified: Secondary | ICD-10-CM | POA: Insufficient documentation

## 2013-07-24 DIAGNOSIS — J329 Chronic sinusitis, unspecified: Secondary | ICD-10-CM | POA: Insufficient documentation

## 2013-07-24 DIAGNOSIS — F329 Major depressive disorder, single episode, unspecified: Secondary | ICD-10-CM | POA: Insufficient documentation

## 2013-07-24 DIAGNOSIS — R259 Unspecified abnormal involuntary movements: Secondary | ICD-10-CM | POA: Insufficient documentation

## 2013-07-24 DIAGNOSIS — J45909 Unspecified asthma, uncomplicated: Secondary | ICD-10-CM | POA: Insufficient documentation

## 2013-07-24 DIAGNOSIS — Z79899 Other long term (current) drug therapy: Secondary | ICD-10-CM | POA: Insufficient documentation

## 2013-07-24 DIAGNOSIS — I428 Other cardiomyopathies: Secondary | ICD-10-CM | POA: Insufficient documentation

## 2013-07-24 DIAGNOSIS — Z7982 Long term (current) use of aspirin: Secondary | ICD-10-CM | POA: Insufficient documentation

## 2013-07-24 DIAGNOSIS — R110 Nausea: Secondary | ICD-10-CM | POA: Insufficient documentation

## 2013-07-24 DIAGNOSIS — I10 Essential (primary) hypertension: Secondary | ICD-10-CM | POA: Diagnosis present

## 2013-07-24 DIAGNOSIS — F3289 Other specified depressive episodes: Secondary | ICD-10-CM | POA: Insufficient documentation

## 2013-07-24 HISTORY — DX: Type 2 diabetes mellitus without complications: E11.9

## 2013-07-24 LAB — CBG MONITORING, ED
Glucose-Capillary: 101 mg/dL — ABNORMAL HIGH (ref 70–99)
Glucose-Capillary: 110 mg/dL — ABNORMAL HIGH (ref 70–99)

## 2013-07-24 LAB — I-STAT TROPONIN, ED: Troponin i, poc: 0 ng/mL (ref 0.00–0.08)

## 2013-07-24 LAB — COMPREHENSIVE METABOLIC PANEL
ALBUMIN: 3.6 g/dL (ref 3.5–5.2)
ALK PHOS: 111 U/L (ref 39–117)
ALT: 32 U/L (ref 0–35)
AST: 37 U/L (ref 0–37)
BUN: 14 mg/dL (ref 6–23)
CHLORIDE: 99 meq/L (ref 96–112)
CO2: 24 mEq/L (ref 19–32)
Calcium: 9.8 mg/dL (ref 8.4–10.5)
Creatinine, Ser: 0.73 mg/dL (ref 0.50–1.10)
GFR calc non Af Amer: 88 mL/min — ABNORMAL LOW (ref >90)
GLUCOSE: 119 mg/dL — AB (ref 70–99)
POTASSIUM: 3.3 meq/L — AB (ref 3.7–5.3)
Sodium: 139 mEq/L (ref 137–147)
Total Bilirubin: 0.4 mg/dL (ref 0.3–1.2)
Total Protein: 7.4 g/dL (ref 6.0–8.3)

## 2013-07-24 LAB — CBC
HEMATOCRIT: 38.8 % (ref 36.0–46.0)
HEMOGLOBIN: 13.3 g/dL (ref 12.0–15.0)
MCH: 28.4 pg (ref 26.0–34.0)
MCHC: 34.3 g/dL (ref 30.0–36.0)
MCV: 82.9 fL (ref 78.0–100.0)
Platelets: 304 10*3/uL (ref 150–400)
RBC: 4.68 MIL/uL (ref 3.87–5.11)
RDW: 13.2 % (ref 11.5–15.5)
WBC: 8.2 10*3/uL (ref 4.0–10.5)

## 2013-07-24 LAB — URINALYSIS, ROUTINE W REFLEX MICROSCOPIC
Bilirubin Urine: NEGATIVE
GLUCOSE, UA: NEGATIVE mg/dL
Hgb urine dipstick: NEGATIVE
KETONES UR: NEGATIVE mg/dL
Leukocytes, UA: NEGATIVE
Nitrite: NEGATIVE
PROTEIN: NEGATIVE mg/dL
Specific Gravity, Urine: 1.003 — ABNORMAL LOW (ref 1.005–1.030)
Urobilinogen, UA: 0.2 mg/dL (ref 0.0–1.0)
pH: 7 (ref 5.0–8.0)

## 2013-07-24 LAB — GLUCOSE, CAPILLARY: Glucose-Capillary: 232 mg/dL — ABNORMAL HIGH (ref 70–99)

## 2013-07-24 MED ORDER — CELECOXIB 100 MG PO CAPS
100.0000 mg | ORAL_CAPSULE | Freq: Every day | ORAL | Status: DC
  Administered 2013-07-25: 100 mg via ORAL
  Filled 2013-07-24: qty 1

## 2013-07-24 MED ORDER — SODIUM CHLORIDE 0.9 % IV BOLUS (SEPSIS)
1000.0000 mL | Freq: Once | INTRAVENOUS | Status: AC
  Administered 2013-07-24: 1000 mL via INTRAVENOUS

## 2013-07-24 MED ORDER — MONTELUKAST SODIUM 10 MG PO TABS
10.0000 mg | ORAL_TABLET | Freq: Every day | ORAL | Status: DC
  Administered 2013-07-25: 10 mg via ORAL
  Filled 2013-07-24 (×3): qty 1

## 2013-07-24 MED ORDER — METOCLOPRAMIDE HCL 5 MG/ML IJ SOLN
10.0000 mg | Freq: Once | INTRAMUSCULAR | Status: AC
  Administered 2013-07-25: 10 mg via INTRAVENOUS
  Filled 2013-07-24: qty 2

## 2013-07-24 MED ORDER — GI COCKTAIL ~~LOC~~
30.0000 mL | Freq: Four times a day (QID) | ORAL | Status: DC | PRN
  Filled 2013-07-24: qty 30

## 2013-07-24 MED ORDER — INSULIN ASPART 100 UNIT/ML ~~LOC~~ SOLN
0.0000 [IU] | Freq: Three times a day (TID) | SUBCUTANEOUS | Status: DC
  Administered 2013-07-25: 2 [IU] via SUBCUTANEOUS

## 2013-07-24 MED ORDER — ALBUTEROL SULFATE (2.5 MG/3ML) 0.083% IN NEBU: 3.0000 mL | INHALATION_SOLUTION | Freq: Four times a day (QID) | RESPIRATORY_TRACT | Status: DC | PRN

## 2013-07-24 MED ORDER — ONDANSETRON HCL 4 MG/2ML IJ SOLN
4.0000 mg | Freq: Once | INTRAMUSCULAR | Status: AC
  Administered 2013-07-24: 4 mg via INTRAVENOUS
  Filled 2013-07-24: qty 2

## 2013-07-24 MED ORDER — DIPHENHYDRAMINE HCL 50 MG/ML IJ SOLN
25.0000 mg | Freq: Once | INTRAMUSCULAR | Status: AC
  Administered 2013-07-25: 25 mg via INTRAVENOUS
  Filled 2013-07-24: qty 1

## 2013-07-24 MED ORDER — TRIAMTERENE-HCTZ 37.5-25 MG PO TABS
1.0000 | ORAL_TABLET | Freq: Every day | ORAL | Status: DC
  Administered 2013-07-25: 1 via ORAL
  Filled 2013-07-24: qty 1

## 2013-07-24 MED ORDER — POTASSIUM CHLORIDE CRYS ER 20 MEQ PO TBCR
40.0000 meq | EXTENDED_RELEASE_TABLET | Freq: Once | ORAL | Status: AC
  Administered 2013-07-24: 40 meq via ORAL
  Filled 2013-07-24: qty 2

## 2013-07-24 MED ORDER — CITALOPRAM HYDROBROMIDE 20 MG PO TABS
20.0000 mg | ORAL_TABLET | Freq: Every day | ORAL | Status: DC
  Administered 2013-07-25: 20 mg via ORAL
  Filled 2013-07-24: qty 1

## 2013-07-24 NOTE — ED Notes (Signed)
Initial contact-pt A&O x4. Reports waking up around 1130 feeling "jittery and with a slight HA." Has had hot and cold flashes along with nausea. Denies numbness or tingling. Says this morning "I felt like I was looking through a fog even with my glasses." Took Metformin this morning. Took 6 units of Humalog at 1145. Was diagnosed with DM II last Friday. Hgb A1C 13.0 at that time with CBG in 300s. Pt is having slight tremors. No other complaints at this time. Awaiting MD.

## 2013-07-24 NOTE — ED Notes (Signed)
Pt now c/o chest pain. Non radiating.

## 2013-07-24 NOTE — ED Notes (Signed)
Bed: WA19 Expected date:  Expected time:  Means of arrival:  Comments: Pt getting dressed 

## 2013-07-24 NOTE — ED Notes (Addendum)
Pt has been newly diagnosed with diabetes on May 29. Pt c/o weakness, feeling 'Jittery', states her vision is a little worse than normal. Pt sent from PCP stating pt needed blood work and fluids.

## 2013-07-24 NOTE — H&P (Signed)
PCP:   Marlou Sa ERIC, MD   Chief Complaint:  shaky  HPI: 63 yo female h/o newly dx diabetes last week started on metformin over one week ago, and insulin ss one week ago, htn, hld, comes in with several days of "not feeling well", gets very shakey and nauseated and overall weak.  No fevers.  No n/v/d.  No abd pain.  No swelling in legs.  Today she called her pcp who told her to come to ED, while in ED she started having some substernal chest tightness that lasted about 15 minutes with associated nausea.  Her a1c with her pcp a couple of weeks ago was over 13%.  She had a cath years ago, which showed minimal nonobs CAD.  She says she has a h/o cardiomyopathy.  No recent stress testing.  Her glucose was over 400 last week, it has been coming down to low 100s.  Does not get any lower than about 110 or 120.  She says she is very anxious right now.  We were asked to obs for r/o acs.  Review of Systems:  Positive and negative as per HPI otherwise all other systems are negative  Past Medical History: Past Medical History  Diagnosis Date  . Asthma   . Hypertension   . Allergic rhinitis   . Sinusitis   . Hyperlipidemia   . Obesity   . GERD (gastroesophageal reflux disease)   . Depression   . Memory impairment   . Heart murmur   . Diabetes mellitus without complication    Past Surgical History  Procedure Laterality Date  . Tubal ligation    . Breast lumpectomy    . Hernia repair    . Leg surgery    . Abdominal hysterectomy      Medications: Prior to Admission medications   Medication Sig Start Date End Date Taking? Authorizing Provider  albuterol (PROVENTIL HFA;VENTOLIN HFA) 108 (90 BASE) MCG/ACT inhaler Inhale 2 puffs into the lungs every 6 (six) hours as needed for wheezing or shortness of breath (SOB).   Yes Historical Provider, MD  aspirin 81 MG tablet Take 81 mg by mouth daily.   Yes Historical Provider, MD  celecoxib (CELEBREX) 100 MG capsule Take 100 mg by mouth daily.   Yes  Historical Provider, MD  citalopram (CELEXA) 20 MG tablet Take 20 mg by mouth daily.   Yes Historical Provider, MD  LOSARTAN POTASSIUM PO Take 1 tablet by mouth daily.   Yes Historical Provider, MD  METFORMIN HCL PO Take by mouth.   Yes Historical Provider, MD  montelukast (SINGULAIR) 10 MG tablet Take 10 mg by mouth at bedtime.   Yes Historical Provider, MD  POTASSIUM CHLORIDE PO Take by mouth.   Yes Historical Provider, MD  triamterene-hydrochlorothiazide (MAXZIDE-25) 37.5-25 MG per tablet Take 1 tablet by mouth daily.   Yes Historical Provider, MD  vitamin E 400 UNIT capsule Take 400 Units by mouth.    Yes Historical Provider, MD    Allergies:   Allergies  Allergen Reactions  . Aspirin-Acetaminophen-Caffeine Anaphylaxis  . Avapro [Irbesartan]   . Avelox [Moxifloxacin]   . Lipitor [Atorvastatin]     Social History:  reports that she has never smoked. She has never used smokeless tobacco. She reports that she drinks alcohol. She reports that she does not use illicit drugs.  Family History: Family History  Problem Relation Age of Onset  . Hypertension Other   . Coronary artery disease Other     Physical Exam:  Filed Vitals:   07/24/13 1651 07/24/13 1917 07/24/13 2050  BP: 145/65 137/80   Pulse: 63 59 51  Temp: 97.7 F (36.5 C) 97.4 F (36.3 C)   TempSrc: Oral Oral   Resp: 16 18 14   SpO2: 98% 99% 100%   General appearance: alert, cooperative and no distress Head: Normocephalic, without obvious abnormality, atraumatic Eyes: negative Nose: Nares normal. Septum midline. Mucosa normal. No drainage or sinus tenderness. Neck: no JVD and supple, symmetrical, trachea midline Lungs: clear to auscultation bilaterally Heart: regular rate and rhythm, S1, S2 normal, no murmur, click, rub or gallop Abdomen: soft, non-tender; bowel sounds normal; no masses,  no organomegaly Extremities: extremities normal, atraumatic, no cyanosis or edema Pulses: 2+ and symmetric Skin: Skin color,  texture, turgor normal. No rashes or lesions Neurologic: Grossly normal    Labs on Admission:   Recent Labs  07/24/13 1914  NA 139  K 3.3*  CL 99  CO2 24  GLUCOSE 119*  BUN 14  CREATININE 0.73  CALCIUM 9.8    Recent Labs  07/24/13 1914  AST 37  ALT 32  ALKPHOS 111  BILITOT 0.4  PROT 7.4  ALBUMIN 3.6    Recent Labs  07/24/13 1914  WBC 8.2  HGB 13.3  HCT 38.8  MCV 82.9  PLT 304   Radiological Exams on Admission: Dg Chest Portable 1 View  07/24/2013   CLINICAL DATA:  Chest tightness.  History of asthma and hypertension  EXAM: PORTABLE CHEST - 1 VIEW  COMPARISON:  CT chest 04/22/2003 and chest radiograph 04/17/2003  FINDINGS: Cardiac leads project of the chest. The heart, mediastinal, and hilar contours are normal. Pulmonary vascularity is normal. The lungs are normally expanded and clear. No visible pleural effusion or pneumothorax.  IMPRESSION: No active disease.   Electronically Signed   By: Curlene Dolphin M.D.   On: 07/24/2013 20:18    Assessment/Plan  62 yo female with atypical chest pain with newly dx diabetes  Principal Problem:   Chest tightness-  Serial enzymes.  Echo in am.  Will need full stress testing at some point due to multiple risk factors.  ekg no acute changes.  Active Problems:  Stable unless o/w noted:   Hypertension   Hyperlipidemia   Diabetes mellitus without complication- some of her symptoms may be side affects of newly introduced metformin and insulin but does not sound like she is technically dropping too low.  Hold metformin for now.  Place on ssi sensitive scale.  Last hga1c done recently was over 13% per pt report.   GERD (gastroesophageal reflux disease)   Obesity  obs on tele.  Full code.  Rachal A Shanon Brow 07/24/2013, 9:57 PM

## 2013-07-24 NOTE — ED Provider Notes (Signed)
CSN: [id number]     Arrival date & time 07/24/13  1630 History   First MD Initiated Contact with Patient 07/24/13 1909     Chief Complaint  Patient presents with  . Diabetes     (Consider location/radiation/quality/duration/timing/severity/associated sxs/prior Treatment) HPI 63 year old female presents with shaking/tremors, weakness and fatigue, and overall not feeling well over the past several days. She states that she was diagnosed with diabetes about a week and a half ago and started on metformin and sliding scale insulin. Her glucose was previously in the 300s and 400s and now is running in the 100s after medicines. She states that her symptoms been slowly worsening although she's not noticed any actual fevers. No shortness of breath. No dysuria. She went to her PCPs office and had a urine that she states was consistent with UTI and was sent here to get fluids and have her electrolytes evaluated as her PCP was concerned about the lateral abnormalities. Patient endorses some chest tightness over the last 20 minutes since being in the waiting room. Denies a shortness of breath. States she's also having burping. She relates this is mild and she gets this when she is stressed.  Past Medical History  Diagnosis Date  . Asthma   . Hypertension   . Allergic rhinitis   . Sinusitis   . Hyperlipidemia   . Obesity   . GERD (gastroesophageal reflux disease)   . Depression   . Memory impairment   . Heart murmur   . Diabetes mellitus without complication    Past Surgical History  Procedure Laterality Date  . Tubal ligation    . Breast lumpectomy    . Hernia repair    . Leg surgery    . Abdominal hysterectomy     Family History  Problem Relation Age of Onset  . Hypertension Other   . Coronary artery disease Other    History  Substance Use Topics  . Smoking status: Never Smoker   . Smokeless tobacco: Never Used  . Alcohol Use: Yes     Comment: glass of wine once or twice a month    OB History   Grav Para Term Preterm Abortions TAB SAB Ect Mult Living                 Review of Systems  Constitutional: Positive for fatigue. Negative for fever and chills.  Respiratory: Positive for chest tightness. Negative for shortness of breath.   Cardiovascular: Negative for chest pain.  Gastrointestinal: Positive for nausea. Negative for vomiting and abdominal pain.  Genitourinary: Negative for dysuria.  Neurological: Positive for tremors and weakness. Negative for headaches.  All other systems reviewed and are negative.     Allergies  Aspirin-acetaminophen-caffeine; Avapro; Avelox; and Lipitor  Home Medications   Prior to Admission medications   Medication Sig Start Date End Date Taking? Authorizing Provider  albuterol (PROVENTIL) (5 MG/ML) 0.5% nebulizer solution Take 2.5 mg by nebulization every 6 (six) hours as needed for wheezing.    Historical Provider, MD  aspirin 81 MG tablet Take 81 mg by mouth daily.    Historical Provider, MD  cetirizine (ZYRTEC) 10 MG tablet Take 10 mg by mouth daily.    Historical Provider, MD  citalopram (CELEXA) 20 MG tablet Take 20 mg by mouth daily.    Historical Provider, MD  Escitalopram Oxalate (LEXAPRO PO) Take by mouth.    Historical Provider, MD  LOSARTAN POTASSIUM PO Take 1 tablet by mouth daily.    Historical Provider,  MD  Pitavastatin Calcium (LIVALO PO) Take 2 mg by mouth daily.     Historical Provider, MD  triamterene-hydrochlorothiazide (MAXZIDE-25) 37.5-25 MG per tablet Take 1 tablet by mouth daily.    Historical Provider, MD  vitamin E 400 UNIT capsule Take 400 Units by mouth daily. Takes 2 a day    Historical Provider, MD   BP 137/80  Pulse 59  Temp(Src) 97.4 F (36.3 C) (Oral)  Resp 18  SpO2 99% Physical Exam  Nursing note and vitals reviewed. Constitutional: She is oriented to person, place, and time. She appears well-developed and well-nourished.  HENT:  Head: Normocephalic and atraumatic.  Right Ear:  External ear normal.  Left Ear: External ear normal.  Nose: Nose normal.  Eyes: Right eye exhibits no discharge. Left eye exhibits no discharge.  Cardiovascular: Normal rate, regular rhythm and normal heart sounds.   Pulmonary/Chest: Effort normal and breath sounds normal.  Abdominal: Soft. She exhibits no distension. There is no tenderness.  Neurological: She is alert and oriented to person, place, and time.  Skin: Skin is warm and dry.    ED Course  Procedures (including critical care time) Labs Review Labs Reviewed  COMPREHENSIVE METABOLIC PANEL - Abnormal; Notable for the following:    Potassium 3.3 (*)    Glucose, Bld 119 (*)    GFR calc non Af Amer 88 (*)    All other components within normal limits  URINALYSIS, ROUTINE W REFLEX MICROSCOPIC - Abnormal; Notable for the following:    Specific Gravity, Urine 1.003 (*)    All other components within normal limits  GLUCOSE, CAPILLARY - Abnormal; Notable for the following:    Glucose-Capillary 232 (*)    All other components within normal limits  CBG MONITORING, ED - Abnormal; Notable for the following:    Glucose-Capillary 101 (*)    All other components within normal limits  CBG MONITORING, ED - Abnormal; Notable for the following:    Glucose-Capillary 110 (*)    All other components within normal limits  CBC  TROPONIN I  TROPONIN I  TROPONIN I  I-STAT TROPOININ, ED    Imaging Review Dg Chest Portable 1 View  07/24/2013   CLINICAL DATA:  Chest tightness.  History of asthma and hypertension  EXAM: PORTABLE CHEST - 1 VIEW  COMPARISON:  CT chest 04/22/2003 and chest radiograph 04/17/2003  FINDINGS: Cardiac leads project of the chest. The heart, mediastinal, and hilar contours are normal. Pulmonary vascularity is normal. The lungs are normally expanded and clear. No visible pleural effusion or pneumothorax.  IMPRESSION: No active disease.   Electronically Signed   By: Curlene Dolphin M.D.   On: 07/24/2013 20:18     EKG  Interpretation   Date/Time:  Monday July 24 2013 19:16:56 EDT Ventricular Rate:  58 PR Interval:  140 QRS Duration: 84 QT Interval:  433 QTC Calculation: 425 R Axis:   71 Text Interpretation:  Sinus rhythm Probable anteroseptal infarct, old  nonspecific ST segments Baseline wander in lead(s) V6 no significant  change from 2005 Confirmed by Edilson Vital  MD, Biff Rutigliano ([id number]) on 07/24/2013  7:34:33 PM      MDM   Final diagnoses:  Hypertension  Hyperlipidemia  Diabetes mellitus without complication  GERD (gastroesophageal reflux disease)  Obesity  Chest tightness    Patient has mild hypokalemia, unlikely to be the cause of her tremors and "weakness". She states her chest tightness is similar to when she required a heart cath 12 years ago with nonobstructive disease.  Pain resolved spontaneously, has nonspecific ST segments. Given she is a diabetic with HTN and HLD, will observe in hospital with hospitalist for ACS rule out.     Ephraim Hamburger, MD 07/25/13 [id number]

## 2013-07-25 ENCOUNTER — Encounter: Payer: Self-pay | Admitting: Family Medicine

## 2013-07-25 LAB — TROPONIN I
Troponin I: <0.3 ng/mL (ref <0.30)
Troponin I: <0.3 ng/mL (ref <0.30)
Troponin I: <0.3 ng/mL (ref <0.30)

## 2013-07-25 LAB — GLUCOSE, CAPILLARY: Glucose-Capillary: 152 mg/dL — ABNORMAL HIGH (ref 70–99)

## 2013-07-25 NOTE — Discharge Summary (Signed)
Physician Discharge Summary  Kristi Bennett FGH:[id number] DOB: 04/24/1950 DOA: 07/24/2013  PCP: Kevan Ny, MD  Admit date: 07/24/2013 Discharge date: 07/25/2013  Time spent: 35 minutes  Recommendations for Outpatient Follow-up:  1. Patient to continue regular dosages of metformin and insulin on sliding scale 2. Recommended follow closely with regular physician  3.   Discharge Diagnoses:  Principal Problem:   Chest tightness Active Problems:   Hypertension   Hyperlipidemia   Diabetes mellitus without complication   GERD (gastroesophageal reflux disease)   Obesity   Discharge Condition: Good   Diet recommendation:  diabetic heart healthy   Filed Weights   07/25/13 0500  Weight: 97.16 kg (214 lb 3.2 oz)    History of present illness:   this 63 year old newly diagnosed diabetic starting on insulin and metformin a week ago came in with Of days of feeling unwell, shaky, nauseated and overall weak he she had no other complaints or issues but felt substernal chest tightness and anxiety. She claimed later on during the stay that she felt anxious. HbA1c over 13% recently she feels that her blood sugar has been coming into the 100 range down from 400. She felt jittery and anxious and sweaty. Because is some risk factors she was admitted to the hospital She ruled out for acute MI with serial troponins and EKG and subsequently was discharged home with instructions to continue her medications, given instructions to take hard candy and glucose If she felt jittery and report to her primary care physician for further care    Discharge Exam: Filed Vitals:   07/25/13 0500  BP: 117/58  Pulse: 56  Temp: 98.2 F (36.8 C)  Resp: 18    General: EOMIO, NCAT Cardiovascular: s1 s2 no m/r/g Respiratory: clear, no added sound  Discharge Instructions You were cared for by a hospitalist during your hospital stay. If you have any questions about your discharge medications or the care you received  while you were in the hospital after you are discharged, you can call the unit and asked to speak with the hospitalist on call if the hospitalist that took care of you is not available. Once you are discharged, your primary care physician will handle any further medical issues. Please note that NO REFILLS for any discharge medications will be authorized once you are discharged, as it is imperative that you return to your primary care physician (or establish a relationship with a primary care physician if you do not have one) for your aftercare needs so that they can reassess your need for medications and monitor your lab values.  Discharge Instructions   Diet - low sodium heart healthy    Complete by:  As directed      Discharge instructions    Complete by:  As directed   Please keep some hard candy available in case you feel jittery.Coumadin if for diabetes when the sugars come under better control and it is hard to determine when this.. Recommend that you discuss further . He about further diabetic care and adjustment of insulin . We will not adjust your medications any further at this stage     Increase activity slowly    Complete by:  As directed             Medication List         albuterol 108 (90 BASE) MCG/ACT inhaler  Commonly known as:  PROVENTIL HFA;VENTOLIN HFA  Inhale 2 puffs into the lungs every 6 (six) hours as needed  for wheezing or shortness of breath (SOB).     aspirin 81 MG tablet  Take 81 mg by mouth daily.     celecoxib 100 MG capsule  Commonly known as:  CELEBREX  Take 100 mg by mouth daily.     citalopram 20 MG tablet  Commonly known as:  CELEXA  Take 20 mg by mouth daily.     losartan 100 MG tablet  Commonly known as:  COZAAR  Take 100 mg by mouth daily.     metFORMIN 500 MG tablet  Commonly known as:  GLUCOPHAGE  Take 500 mg by mouth See admin instructions. Daily with largest meal     montelukast 10 MG tablet  Commonly known as:  SINGULAIR  Take 10 mg  by mouth at bedtime.     potassium chloride SA 20 MEQ tablet  Commonly known as:  K-DUR,KLOR-CON  Take 20 mEq by mouth daily.     triamterene-hydrochlorothiazide 37.5-25 MG per tablet  Commonly known as:  MAXZIDE-25  Take 1 tablet by mouth daily.     vitamin E 400 UNIT capsule  Take 400 Units by mouth.       Allergies  Allergen Reactions  . Excedrin Extra Strength [Aspirin-Acetaminophen-Caffeine] Anaphylaxis  . Avapro [Irbesartan]     unknown  . Avelox [Moxifloxacin]     unknown  . Lipitor [Atorvastatin]     unknown      The results of significant diagnostics from this hospitalization (including imaging, microbiology, ancillary and laboratory) are listed below for reference.    Significant Diagnostic Studies: Dg Chest Portable 1 View  07/24/2013   CLINICAL DATA:  Chest tightness.  History of asthma and hypertension  EXAM: PORTABLE CHEST - 1 VIEW  COMPARISON:  CT chest 04/22/2003 and chest radiograph 04/17/2003  FINDINGS: Cardiac leads project of the chest. The heart, mediastinal, and hilar contours are normal. Pulmonary vascularity is normal. The lungs are normally expanded and clear. No visible pleural effusion or pneumothorax.  IMPRESSION: No active disease.   Electronically Signed   By: Curlene Dolphin M.D.   On: 07/24/2013 20:18    Microbiology: No results found for this or any previous visit (from the past 240 hour(s)).   Labs: Basic Metabolic Panel:  Recent Labs Lab 07/24/13 1914  NA 139  K 3.3*  CL 99  CO2 24  GLUCOSE 119*  BUN 14  CREATININE 0.73  CALCIUM 9.8   Liver Function Tests:  Recent Labs Lab 07/24/13 1914  AST 37  ALT 32  ALKPHOS 111  BILITOT 0.4  PROT 7.4  ALBUMIN 3.6   No results found for this basename: LIPASE, AMYLASE,  in the last 168 hours No results found for this basename: AMMONIA,  in the last 168 hours CBC:  Recent Labs Lab 07/24/13 1914  WBC 8.2  HGB 13.3  HCT 38.8  MCV 82.9  PLT 304   Cardiac Enzymes:  Recent  Labs Lab 07/24/13 2242 07/25/13 0456  TROPONINI <0.30 <0.30   BNP: BNP (last 3 results) No results found for this basename: PROBNP,  in the last 8760 hours CBG:  Recent Labs Lab 07/24/13 1653 07/24/13 1912 07/24/13 2249 07/25/13 0737  GLUCAP 101* 110* 232* 152*       Signed:  Jai-Gurmukh Rynn Markiewicz  [facility] 07/25/2013, 10:34 AM

## 2013-07-25 NOTE — Care Management Note (Signed)
    Page 1 of 1   07/25/2013     11:42:52 AM CARE MANAGEMENT NOTE 07/25/2013  Patient:  Kristi Bennett, Kristi Bennett   Account Number:  [id number]  Date Initiated:  07/25/2013  Documentation initiated by:  Dessa Phi  Subjective/Objective Assessment:   63 Y/O F ADMITTED CHEST TIGHTNESS.     Action/Plan:   FROM HOME.   Anticipated DC Date:  07/25/2013   Anticipated DC Plan:  [place]  CM consult      Choice offered to / List presented to:             Status of service:  Completed, signed off Medicare Important Message given?   (If response is "NO", the following Medicare IM given date fields will be blank) Date Medicare IM given:   Date Additional Medicare IM given:    Discharge Disposition:  HOME/SELF CARE  Per UR Regulation:  Reviewed for med. necessity/level of care/duration of stay  If discussed at Long Length of Stay Meetings, dates discussed:    Comments:  07/25/13 Shiya Fogelman RN,BSN NCM [phone] NO ANTICIPATED D/C NEEDS.

## 2013-08-01 ENCOUNTER — Ambulatory Visit
Admission: RE | Admit: 2013-08-01 | Discharge: 2013-08-01 | Disposition: A | Discharge status: Home / Self Care | Payer: BC Managed Care – PPO | Source: Ambulatory Visit | Attending: Internal Medicine | Admitting: Internal Medicine

## 2013-08-01 ENCOUNTER — Other Ambulatory Visit: Payer: Self-pay | Admitting: Internal Medicine

## 2013-08-01 DIAGNOSIS — R609 Edema, unspecified: Secondary | ICD-10-CM

## 2013-08-01 DIAGNOSIS — R52 Pain, unspecified: Secondary | ICD-10-CM

## 2013-08-15 ENCOUNTER — Other Ambulatory Visit (HOSPITAL_COMMUNITY): Payer: Self-pay | Admitting: Cardiology

## 2013-08-15 DIAGNOSIS — R079 Chest pain, unspecified: Secondary | ICD-10-CM

## 2013-08-17 ENCOUNTER — Encounter (HOSPITAL_COMMUNITY)
Admission: RE | Admit: 2013-08-17 | Discharge: 2013-08-17 | Disposition: A | Discharge status: Home / Self Care | Payer: BC Managed Care – PPO | Source: Ambulatory Visit | Attending: Cardiology | Admitting: Cardiology

## 2013-08-17 ENCOUNTER — Other Ambulatory Visit: Payer: Self-pay

## 2013-08-17 DIAGNOSIS — R079 Chest pain, unspecified: Secondary | ICD-10-CM

## 2013-08-17 MED ORDER — REGADENOSON 0.4 MG/5ML IV SOLN
0.4000 mg | Freq: Once | INTRAVENOUS | Status: AC
  Administered 2013-08-17: 0.4 mg via INTRAVENOUS

## 2013-08-17 MED ORDER — REGADENOSON 0.4 MG/5ML IV SOLN
INTRAVENOUS | Status: AC
  Filled 2013-08-17: qty 5

## 2013-08-17 MED ORDER — TECHNETIUM TC 99M SESTAMIBI - CARDIOLITE
10.0000 | Freq: Once | INTRAVENOUS | Status: AC | PRN
  Administered 2013-08-17: 10 via INTRAVENOUS

## 2013-08-17 MED ORDER — TECHNETIUM TC 99M SESTAMIBI GENERIC - CARDIOLITE
30.0000 | Freq: Once | INTRAVENOUS | Status: AC | PRN
  Administered 2013-08-17: 30 via INTRAVENOUS

## 2014-04-05 ENCOUNTER — Ambulatory Visit (HOSPITAL_COMMUNITY)
Admission: RE | Admit: 2014-04-05 | Discharge: 2014-04-06 | Disposition: A | Discharge status: Home / Self Care | Payer: Medicare HMO | Source: Ambulatory Visit | Attending: Cardiology | Admitting: Cardiology

## 2014-04-05 ENCOUNTER — Encounter (HOSPITAL_COMMUNITY): Payer: Self-pay | Admitting: *Deleted

## 2014-04-05 ENCOUNTER — Encounter (HOSPITAL_COMMUNITY)
Admission: RE | Disposition: A | Discharge status: Home / Self Care | Payer: Commercial Managed Care - HMO | Source: Ambulatory Visit | Attending: Cardiology

## 2014-04-05 DIAGNOSIS — I2510 Atherosclerotic heart disease of native coronary artery without angina pectoris: Secondary | ICD-10-CM | POA: Insufficient documentation

## 2014-04-05 DIAGNOSIS — J45909 Unspecified asthma, uncomplicated: Secondary | ICD-10-CM | POA: Insufficient documentation

## 2014-04-05 DIAGNOSIS — E780 Pure hypercholesterolemia: Secondary | ICD-10-CM | POA: Insufficient documentation

## 2014-04-05 DIAGNOSIS — I209 Angina pectoris, unspecified: Secondary | ICD-10-CM | POA: Diagnosis present

## 2014-04-05 DIAGNOSIS — Z6836 Body mass index (BMI) 36.0-36.9, adult: Secondary | ICD-10-CM | POA: Diagnosis not present

## 2014-04-05 DIAGNOSIS — I10 Essential (primary) hypertension: Secondary | ICD-10-CM | POA: Diagnosis not present

## 2014-04-05 DIAGNOSIS — E119 Type 2 diabetes mellitus without complications: Secondary | ICD-10-CM | POA: Insufficient documentation

## 2014-04-05 DIAGNOSIS — Z955 Presence of coronary angioplasty implant and graft: Secondary | ICD-10-CM

## 2014-04-05 HISTORY — PX: LEFT HEART CATHETERIZATION WITH CORONARY ANGIOGRAM: SHX5451

## 2014-04-05 LAB — GLUCOSE, CAPILLARY
GLUCOSE-CAPILLARY: 151 mg/dL — AB (ref 70–99)
GLUCOSE-CAPILLARY: 85 mg/dL (ref 70–99)
Glucose-Capillary: 111 mg/dL — ABNORMAL HIGH (ref 70–99)
Glucose-Capillary: 108 mg/dL — ABNORMAL HIGH (ref 70–99)

## 2014-04-05 LAB — POCT ACTIVATED CLOTTING TIME: ACTIVATED CLOTTING TIME: 405 s

## 2014-04-05 SURGERY — LEFT HEART CATHETERIZATION WITH CORONARY ANGIOGRAM
Anesthesia: LOCAL

## 2014-04-05 MED ORDER — ASPIRIN 81 MG PO CHEW
CHEWABLE_TABLET | ORAL | Status: AC
  Filled 2014-04-05: qty 1

## 2014-04-05 MED ORDER — CITALOPRAM HYDROBROMIDE 20 MG PO TABS
20.0000 mg | ORAL_TABLET | Freq: Every day | ORAL | Status: DC
  Administered 2014-04-05: 20 mg via ORAL
  Filled 2014-04-05 (×3): qty 1

## 2014-04-05 MED ORDER — SODIUM CHLORIDE 0.9 % IV SOLN
INTRAVENOUS | Status: DC
  Administered 2014-04-05: 06:00:00 via INTRAVENOUS

## 2014-04-05 MED ORDER — NITROGLYCERIN IN D5W 200-5 MCG/ML-% IV SOLN: 5.0000 ug/min | INTRAVENOUS | Status: DC

## 2014-04-05 MED ORDER — SODIUM CHLORIDE 0.9 % IV SOLN: 250.0000 mL | INTRAVENOUS | Status: DC | PRN

## 2014-04-05 MED ORDER — MIDAZOLAM HCL 2 MG/2ML IJ SOLN
INTRAMUSCULAR | Status: AC
  Filled 2014-04-05: qty 2

## 2014-04-05 MED ORDER — FENTANYL CITRATE 0.05 MG/ML IJ SOLN
INTRAMUSCULAR | Status: AC
  Filled 2014-04-05: qty 2

## 2014-04-05 MED ORDER — ONDANSETRON HCL 4 MG/2ML IJ SOLN: 4.0000 mg | Freq: Four times a day (QID) | INTRAMUSCULAR | Status: DC | PRN

## 2014-04-05 MED ORDER — HEPARIN (PORCINE) IN NACL 2-0.9 UNIT/ML-% IJ SOLN
INTRAMUSCULAR | Status: AC
  Filled 2014-04-05: qty 1500

## 2014-04-05 MED ORDER — INSULIN ASPART 100 UNIT/ML ~~LOC~~ SOLN
0.0000 [IU] | Freq: Three times a day (TID) | SUBCUTANEOUS | Status: DC
  Administered 2014-04-06: 1 [IU] via SUBCUTANEOUS

## 2014-04-05 MED ORDER — BIVALIRUDIN 250 MG IV SOLR
INTRAVENOUS | Status: AC
  Filled 2014-04-05: qty 250

## 2014-04-05 MED ORDER — ASPIRIN 81 MG PO CHEW
81.0000 mg | CHEWABLE_TABLET | Freq: Every day | ORAL | Status: DC
  Administered 2014-04-06: 81 mg via ORAL
  Filled 2014-04-05: qty 1

## 2014-04-05 MED ORDER — PANTOPRAZOLE SODIUM 40 MG PO TBEC
40.0000 mg | DELAYED_RELEASE_TABLET | Freq: Every day | ORAL | Status: DC
  Administered 2014-04-06: 40 mg via ORAL
  Filled 2014-04-05: qty 1

## 2014-04-05 MED ORDER — LIDOCAINE HCL (PF) 1 % IJ SOLN
INTRAMUSCULAR | Status: AC
  Filled 2014-04-05: qty 30

## 2014-04-05 MED ORDER — SODIUM CHLORIDE 0.9 % IJ SOLN: 3.0000 mL | Freq: Two times a day (BID) | INTRAMUSCULAR | Status: DC

## 2014-04-05 MED ORDER — SODIUM CHLORIDE 0.9 % IJ SOLN: 3.0000 mL | INTRAMUSCULAR | Status: DC | PRN

## 2014-04-05 MED ORDER — ASPIRIN 81 MG PO CHEW
81.0000 mg | CHEWABLE_TABLET | ORAL | Status: AC
  Administered 2014-04-05: 81 mg via ORAL

## 2014-04-05 MED ORDER — ATORVASTATIN CALCIUM 20 MG PO TABS: 20.0000 mg | ORAL_TABLET | Freq: Every day | ORAL | Status: DC

## 2014-04-05 MED ORDER — SODIUM CHLORIDE 0.9 % IV SOLN
INTRAVENOUS | Status: AC
  Administered 2014-04-05: 18:00:00 via INTRAVENOUS

## 2014-04-05 MED ORDER — NITROGLYCERIN 1 MG/10 ML FOR IR/CATH LAB
INTRA_ARTERIAL | Status: AC
  Filled 2014-04-05: qty 10

## 2014-04-05 MED ORDER — ACETAMINOPHEN 325 MG PO TABS
650.0000 mg | ORAL_TABLET | ORAL | Status: DC | PRN
  Administered 2014-04-05 – 2014-04-06 (×2): 650 mg via ORAL
  Filled 2014-04-05 (×2): qty 2

## 2014-04-05 MED ORDER — NITROGLYCERIN IN D5W 200-5 MCG/ML-% IV SOLN
INTRAVENOUS | Status: AC
  Filled 2014-04-05: qty 250

## 2014-04-05 MED ORDER — PRASUGREL HCL 10 MG PO TABS
10.0000 mg | ORAL_TABLET | Freq: Every day | ORAL | Status: DC
  Administered 2014-04-06: 11:00:00 10 mg via ORAL
  Filled 2014-04-05 (×2): qty 1

## 2014-04-05 MED ORDER — OMEGA-3-ACID ETHYL ESTERS 1 G PO CAPS
1.0000 g | ORAL_CAPSULE | Freq: Every day | ORAL | Status: DC
  Administered 2014-04-05 – 2014-04-06 (×2): 1 g via ORAL
  Filled 2014-04-05 (×3): qty 1

## 2014-04-05 MED ORDER — PRASUGREL HCL 10 MG PO TABS
ORAL_TABLET | ORAL | Status: AC
  Filled 2014-04-05: qty 6

## 2014-04-05 MED ORDER — ROSUVASTATIN CALCIUM 5 MG PO TABS
5.0000 mg | ORAL_TABLET | Freq: Every day | ORAL | Status: DC
  Administered 2014-04-05: 17:00:00 5 mg via ORAL
  Filled 2014-04-05 (×3): qty 1

## 2014-04-05 MED ORDER — ALPRAZOLAM 0.5 MG PO TABS
1.0000 mg | ORAL_TABLET | Freq: Two times a day (BID) | ORAL | Status: DC | PRN
  Administered 2014-04-05: 22:00:00 1 mg via ORAL
  Filled 2014-04-05: qty 2

## 2014-04-05 NOTE — Interval H&P Note (Signed)
Cath Lab Visit (complete for each Cath Lab visit)  Clinical Evaluation Leading to the Procedure:   ACS: No.  Non-ACS:    Anginal Classification: CCS III  Anti-ischemic medical therapy: Maximal Therapy (2 or more classes of medications)  Non-Invasive Test Results: No non-invasive testing performed  Prior CABG: No previous CABG      History and Physical Interval Note:  04/05/2014 7:53 AM  Kristi Bennett  has presented today for surgery, with the diagnosis of cp  The various methods of treatment have been discussed with the patient and family. After consideration of risks, benefits and other options for treatment, the patient has consented to  Procedure(s): LEFT HEART CATHETERIZATION WITH CORONARY ANGIOGRAM (N/A) as a surgical intervention .  The patient's history has been reviewed, patient examined, no change in status, stable for surgery.  I have reviewed the patient's chart and labs.  Questions were answered to the patient's satisfaction.     Clent Demark

## 2014-04-05 NOTE — Progress Notes (Signed)
Site area: rt groin Site Prior to Removal:  Level 0 Pressure Applied For: 20 minutes Manual:   yes Patient Status During Pull:  stable Post Pull Site:  Level 0 Post Pull Instructions Given:  yes Post Pull Pulses Present: yes Dressing Applied:  tegaderm Bedrest begins @ 1219 Comments: 0 complications

## 2014-04-05 NOTE — Progress Notes (Signed)
Pt has allergy to lipitor.  rn spoke with elizabeth with pharmacy who stated client could take med with allergy to lipitor. Pharmacist instructed rn  to monitor for adverse reactions.  rn reviewed adverse reactions with client and to report to rn immediately if they occur. Client verbalized understanding.

## 2014-04-05 NOTE — Progress Notes (Signed)
Subjective:  Doing well denies any chest pain or shortness of breath occasional episodes of bradycardia is symptomatic. Will hold beta blockers  Objective:  Vital Signs in the last 24 hours: Temp:  [98.4 F (36.9 C)] 98.4 F (36.9 C) (02/18 0536) Pulse Rate:  [50-78] 52 (02/18 1030) Resp:  [8-23] 12 (02/18 1030) BP: (131-157)/(52-80) 131/52 mmHg (02/18 1030) SpO2:  [99 %-100 %] 100 % (02/18 1030) Weight:  [94.348 kg (208 lb)] 94.348 kg (208 lb) (02/18 0536)  Intake/Output from previous day:   Intake/Output from this shift:    Physical Exam: Neck: no adenopathy, no carotid bruit, no JVD and supple, symmetrical, trachea midline Lungs: clear to auscultation bilaterally Heart: regular rate and rhythm, S1, S2 normal and Soft systolic murmur noted Abdomen: soft, non-tender; bowel sounds normal; no masses,  no organomegaly Extremities: extremities normal, atraumatic, no cyanosis or edema  Lab Results: No results for input(s): WBC, HGB, PLT in the last 72 hours. No results for input(s): NA, K, CL, CO2, GLUCOSE, BUN, CREATININE in the last 72 hours. No results for input(s): TROPONINI in the last 72 hours.  Invalid input(s): CK, MB Hepatic Function Panel No results for input(s): PROT, ALBUMIN, AST, ALT, ALKPHOS, BILITOT, BILIDIR, IBILI in the last 72 hours. No results for input(s): CHOL in the last 72 hours. No results for input(s): PROTIME in the last 72 hours.  Imaging: Imaging results have been reviewed and No results found.  Cardiac Studies:  Assessment/Plan:  New-onset angina and status post left cardiac cath/PTCA stenting to proximal LAD with excellent results Hypertension Diabetes mellitus Hypercholesteremia Morbid obesity History of bronchial asthma Plan Hold beta blockers for now Continue rest of the medications Check labs in a.m.     Lyberti Thrush N 04/05/2014, 10:59 AM

## 2014-04-05 NOTE — Progress Notes (Signed)
Son in to visit. Waiting for 6500 bed assignment.

## 2014-04-05 NOTE — H&P (Signed)
H&P in the chart needs to be scanned

## 2014-04-05 NOTE — Progress Notes (Signed)
Son in. Patient eating [place] sandwich.

## 2014-04-05 NOTE — CV Procedure (Signed)
Left cardiac as/PTCA stenting to proximal LAD report dictated on 04/05/2014 dictation number is [id number]

## 2014-04-05 NOTE — Progress Notes (Signed)
Dr. Terrence Dupont paged and made aware of HR of 48 up to low 70's SR. Patient states it feels like a hiccup and feels like she has to take a deep breath. Order received to hold Lopressor.

## 2014-04-06 DIAGNOSIS — I10 Essential (primary) hypertension: Secondary | ICD-10-CM | POA: Diagnosis not present

## 2014-04-06 DIAGNOSIS — E780 Pure hypercholesterolemia: Secondary | ICD-10-CM | POA: Diagnosis not present

## 2014-04-06 DIAGNOSIS — E119 Type 2 diabetes mellitus without complications: Secondary | ICD-10-CM | POA: Diagnosis not present

## 2014-04-06 DIAGNOSIS — I2510 Atherosclerotic heart disease of native coronary artery without angina pectoris: Secondary | ICD-10-CM | POA: Diagnosis not present

## 2014-04-06 LAB — CBC
HCT: 35.4 % — ABNORMAL LOW (ref 36.0–46.0)
Hemoglobin: 11.7 g/dL — ABNORMAL LOW (ref 12.0–15.0)
MCH: 27.1 pg (ref 26.0–34.0)
MCHC: 33.1 g/dL (ref 30.0–36.0)
MCV: 82.1 fL (ref 78.0–100.0)
Platelets: 265 10*3/uL (ref 150–400)
RBC: 4.31 MIL/uL (ref 3.87–5.11)
RDW: 14.1 % (ref 11.5–15.5)
WBC: 6.5 10*3/uL (ref 4.0–10.5)

## 2014-04-06 LAB — TROPONIN I
Troponin I: 0.12 ng/mL — ABNORMAL HIGH (ref <0.031)
Troponin I: 0.07 ng/mL — ABNORMAL HIGH (ref <0.031)

## 2014-04-06 LAB — BASIC METABOLIC PANEL
ANION GAP: 7 (ref 5–15)
BUN: 8 mg/dL (ref 6–23)
CALCIUM: 8.3 mg/dL — AB (ref 8.4–10.5)
CO2: 23 mmol/L (ref 19–32)
Chloride: 108 mmol/L (ref 96–112)
Creatinine, Ser: 0.59 mg/dL (ref 0.50–1.10)
GFR calc Af Amer: >90 mL/min (ref >90)
GFR calc non Af Amer: >90 mL/min (ref >90)
GLUCOSE: 135 mg/dL — AB (ref 70–99)
Potassium: 3.1 mmol/L — ABNORMAL LOW (ref 3.5–5.1)
Sodium: 138 mmol/L (ref 135–145)

## 2014-04-06 LAB — HEMOGLOBIN A1C
Hgb A1c MFr Bld: 6.9 % — ABNORMAL HIGH (ref 4.8–5.6)
Mean Plasma Glucose: 151 mg/dL

## 2014-04-06 LAB — GLUCOSE, CAPILLARY: Glucose-Capillary: 135 mg/dL — ABNORMAL HIGH (ref 70–99)

## 2014-04-06 MED ORDER — NITROGLYCERIN 0.4 MG SL SUBL
0.4000 mg | SUBLINGUAL_TABLET | SUBLINGUAL | 3 refills | Status: AC | PRN
  Filled 2022-09-03: qty 25, 8d supply, fill #0

## 2014-04-06 MED ORDER — POTASSIUM CHLORIDE CRYS ER 20 MEQ PO TBCR
40.0000 meq | EXTENDED_RELEASE_TABLET | Freq: Once | ORAL | Status: AC
  Administered 2014-04-06: 40 meq via ORAL
  Filled 2014-04-06: qty 2

## 2014-04-06 MED ORDER — METFORMIN HCL 500 MG PO TABS: 500.0000 mg | ORAL_TABLET | Freq: Two times a day (BID) | ORAL | Status: DC

## 2014-04-06 MED ORDER — PRASUGREL HCL 10 MG PO TABS: 10.0000 mg | ORAL_TABLET | Freq: Every day | ORAL | Status: DC

## 2014-04-06 MED ORDER — ROSUVASTATIN CALCIUM 5 MG PO TABS: 5.0000 mg | ORAL_TABLET | Freq: Every day | ORAL | Status: AC

## 2014-04-06 MED ORDER — AMLODIPINE BESYLATE 5 MG PO TABS: 5.0000 mg | ORAL_TABLET | Freq: Every day | ORAL | Status: AC

## 2014-04-06 MED FILL — Sodium Chloride IV Soln 0.9%: INTRAVENOUS | Qty: 50 | Status: AC

## 2014-04-06 NOTE — Cardiovascular Report (Signed)
NAMEAPOLONIA, ELLWOOD              ACCOUNT NO.:  [id number]  MEDICAL RECORD NO.:  [id number]  LOCATION:  6C07C                        FACILITY:  [facility]  PHYSICIAN:  Daphyne Miguez N. Terrence Dupont, M.D. DATE OF BIRTH:  1951/01/05  DATE OF PROCEDURE:  04/05/2014 DATE OF DISCHARGE:                           CARDIAC CATHETERIZATION   PROCEDURES: 1. Left cardiac catheterization with selective left and right coronary     angiography, left ventriculography via right groin using Judkins     technique. 2. Successful percutaneous transluminal coronary angioplasty to     proximal left anterior descending artery using 2.5 x 12-mm long     Emerge balloon.  3.  Successful deployment of 3.0 x 23-mm long     Xience Alpine drug-eluting stent in proximal left anterior     descending artery. 3. Successful postdilatation of this stent using 3.5 x 15-mm long [place]     Trek balloon.  INDICATION FOR THE PROCEDURE:  Ms. Glassner is 64 year old female with past medical history significant for hypertension, type 2 diabetes mellitus, hypercholesteremia, history of bronchial asthma, morbid obesity, came to office complaining of retrosternal chest pain described as pain across the chest and tightness, grade 5/10, associated with dyspnea and diaphoresis with minimal exertion, relieves with rest.  The patient denies any nausea, vomiting, denies any PND orthopnea or leg swelling.  EKG done in my office showed normal sinus rhythm with minor ST-T wave changes in inferior leads.  Due to typical anginal chest pain, multiple risk factors, discussed with the patient various options for treatment, i.e., noninvasive stress testing versus left cath, possible PTCA stenting, its risks and benefits, i.e., death, MI, stroke, need for emergency CABG, local vascular complications, etc. and consented for PCI.  DESCRIPTION OF PROCEDURE:  After obtaining the informed consent, the patient was brought to the cath lab and was placed on fluoroscopy  table. Right groin was prepped and draped in usual fashion.  Xylocaine 1% was used for local anesthesia in the right groin.  With the help of thin wall needle, 5-French arterial sheath was placed.  The sheath was aspirated and flushed.  Next, a 5-French left Judkins catheter was advanced over the wire under fluoroscopic guidance up to the ascending aorta.  Wire was pulled out, the catheter was aspirated and connected to the Manifold.  Catheter was further advanced and engaged into left coronary ostium.  Multiple views of the left system were taken.  Next, the catheter was disengaged and was pulled out over the wire and was replaced with 5-French right Judkins catheter, which was advanced over the wire under fluoroscopic guidance up to the ascending aorta.  Wire was pulled out, the catheter was aspirated and connected to the Manifold.  Catheter was further advanced and engaged into right coronary ostium.  Multiple views of the right system were taken.  Next, catheter was disengaged and was pulled out over the wire and was replaced with 5- French pigtail catheter, which was advanced over the wire under fluoroscopic guidance up to the ascending aorta.  Wire was pulled out, the catheter was aspirated and connected to the Manifold.  Catheter was further advanced across the aortic valve into the LV.  LV pressures were  recorded.  Next, left ventriculography was done in 30-degree RAO position.  Post-angiographic pressures were recorded from LV and then pullback pressures were recorded from the aorta.  There was no gradient across the aortic valve.  Next, the pigtail catheter was pulled out over the wire.  Sheaths were aspirated and flushed.  FINDINGS:  LV showed good LV systolic function.  LVH; EF of 55-60%. Left main has 50-60% distal stenosis.  LAD has 20-30% ostial and 70-75% proximal and 50-60% mid-bifurcation stenosis with diagonal 2.  Diagonal 1 has 40-50% ostial stenosis.  Diagonal 2 has  30-40% ostial stenosis. Left circumflex has 20-30% ostial stenosis.  OM1 is very, very small. OM 2 is small, which is patent.  OM3 is very, very small.  RCA is patent.  PDA and PLV branches were patent.  INTERVENTIONAL PROCEDURE:  Successful PTCA to proximal LAD was done using 2.5 x 12-mm long Emerge balloon for predilatation and then 3.0 x 23-mm long Xience Alpine drug-eluting stent was deployed at 11 atmospheric pressure in proximal LAD.  This stent was postdilated using 3.0 x 15-mm long [place] Trek balloon going up to 18 atmospheric pressure. Lesion dilated from 70-75% to 0% residual with excellent TIMI grade 3 distal flow without evidence of dissection or distal embolization.  The patient received weight based Angiomax and 60 mg of prasugrel during the procedure.  The patient tolerated the procedure well.  There were no complications.  The patient was transferred to recovery room in stable condition.     Allegra Lai. Terrence Dupont, M.D.     MNH/MEDQ  D:  04/05/2014  T:  04/06/2014  Job:  [id number]

## 2014-04-06 NOTE — Progress Notes (Signed)
CARDIAC REHAB PHASE I   PRE:  Rate/Rhythm: 72 SR    BP: sitting 163/71    SaO2:   MODE:  Ambulation: 500 ft   POST:  Rate/Rhythm: 96 SR    BP: sitting 106/75, standing 130/59, recheck standing 128/63     SaO2: 100 RA  On arrival pt up walking around room, feeling good. Ambulated well, no problems until almost back to room and pt c/o lightheadedness and SOB. Able to make it back to room but BP 60 point lower than before walking (see above). Pt remained lightheaded and SOB for about 5 min. Had her stand up to take orthostatics, which were normal. Dizziness resolved when she stood. No other problems while I was with her. Ed completed reinforcing angina, NTG, ex, Effient/ASA and CRPII. Pt interested in [place] and will send referral to [place]. [place], [place], ACSM 04/06/2014 8:51 AM

## 2014-04-06 NOTE — Discharge Instructions (Signed)
Coronary Angiogram With Stent, Care After °Refer to this sheet in the next few weeks. These instructions provide you with information on caring for yourself after your procedure. Your health care provider may also give you more specific instructions. Your treatment has been planned according to current medical practices, but problems sometimes occur. Call your health care provider if you have any problems or questions after your procedure.  °WHAT TO EXPECT AFTER THE PROCEDURE  °The insertion site may be tender for a few days after your procedure. °HOME CARE INSTRUCTIONS  °· Take medicines only as directed by your health care provider. Blood thinners may be prescribed after your procedure to improve blood flow through the stent. °· Change any bandages (dressings) as directed by your health care provider.   °· Check your insertion site every day for redness, swelling, or fluid leaking from the insertion.   °· Do not take baths, swim, or use a hot tub until your health care provider approves. You may shower. Pat the insertion area dry. Do not rub the insertion area with a washcloth or towel.   °· Eat a heart-healthy diet. This should include plenty of fresh fruits and vegetables. Meat should be lean cuts. Avoid the following types of food:   °¨ Food that is high in salt.   °¨ Canned or highly processed food.   °¨ Food that is high in saturated fat or sugar.   °¨ Fried food.   °· Make any other lifestyle changes recommended by your health care provider. This may include:   °¨ Not using any tobacco products including cigarettes, chewing tobacco, or electronic cigarettes.  °¨ Managing your weight.   °¨ Getting regular exercise.   °¨ Managing your blood pressure.   °¨ Limiting your alcohol intake.   °¨ Managing other health problems, such as diabetes.   °· If you need an MRI after your heart stent was placed, be sure to tell the health care provider who orders the MRI that you have a heart stent.   °· Keep all follow-up  visits as directed by your health care provider.   °SEEK IMMEDIATE MEDICAL CARE IF:  °· You develop chest pain, shortness of breath, feel faint, or pass out. °· You have bleeding, swelling larger than a walnut, or drainage from the catheter insertion site. °· You develop pain, discoloration, coldness, or severe bruising in the leg or arm that held the catheter. °· You develop bleeding from any other place such as from the bowels. There may be bright red blood in the urine or stools, or it may appear as black, tarry stools. °· You have a fever or chills. °MAKE SURE YOU: °· Understand these instructions. °· Will watch your condition. °· Will get help right away if you are not doing well or get worse. °Document Released: 08/22/2004 Document Revised: 06/19/2013 Document Reviewed: 07/06/2012 °ExitCare® Patient Information ©2015 [employer]. This information is not intended to replace advice given to you by your health care provider. Make sure you discuss any questions you have with your health care provider. ° °

## 2014-04-06 NOTE — Progress Notes (Signed)
Pt's troponin resulted at 0.12.  Dr.Harwani made aware. Order to draw 2nd troponin in am. No chest pain. VSS.

## 2014-04-06 NOTE — Progress Notes (Signed)
Pt began to have some chest tightness while lying in bed. Pt states it feels like a hiccup and that her heart is skipping a beat. BP 154/70, SB at 53. EKG shows no changes. NTG gtt increased from 71mcg to 62mcg. Oxygen 2L applied. After just a couple of minutes the tightness was gone and she stated she only felt some tingling in her chest. Pt seems to be a little nervous, so 1mg  xanax was given.  Dr. Terrence Dupont was notified and orders received for troponin now and in am.

## 2014-04-06 NOTE — Care Management Note (Signed)
    Page 1 of 1   04/06/2014     2:03:02 PM CARE MANAGEMENT NOTE 04/06/2014  Patient:  Kristi Bennett, Kristi Bennett   Account Number:  [id number]  Date Initiated:  04/06/2014  Documentation initiated by:  Terriana Barreras  Subjective/Objective Assessment:   Pt s/p PCI on 04/05/14.     Action/Plan:   Pt to dc on Effient therapy.   Anticipated DC Date:  04/06/2014   Anticipated DC Plan:  [place]  CM consult  Medication Assistance      Choice offered to / List presented to:             Status of service:  Completed, signed off Medicare Important Message given?  NA - LOS <3 / Initial given by admissions (If response is "NO", the following Medicare IM given date fields will be blank) Date Medicare IM given:   Medicare IM given by:   Date Additional Medicare IM given:   Additional Medicare IM given by:    Discharge Disposition:  HOME/SELF CARE  Per UR Regulation:  Reviewed for med. necessity/level of care/duration of stay  If discussed at [place] of Stay Meetings, dates discussed:    Comments:  04/06/14 Ellan Lambert, RN, BSN [phone]  04/06/2014 [facility] Customer Service at [phone]. Talked to [place] with the Retail Pharmacy information (Ref # [id number]) and CSR Roselyn Reef with the Mail Order information (Ref # [id number]). EFFIENT is covered as a TIER 3 Medication. No Prior Authorization required. Retail Pharmacy Co-Payment for a 30 Day Supply is $47.00. Mail [place] for a 90 Day Supply is $131.00. Available at several retail pharmacies including [facility], [facility], etc.       [facility].   Pt given Effient booklet with 30 day free trial card.

## 2014-04-07 NOTE — Discharge Summary (Signed)
[name], [name]              ACCOUNT NO.:  [id number]  MEDICAL RECORD NO.:  [id number]  LOCATION:  6C07C                        FACILITY:  [facility]  PHYSICIAN:  Maxen Rowland N. Terrence Dupont, M.D. DATE OF BIRTH:  02/28/1950  DATE OF ADMISSION:  04/05/2014 DATE OF DISCHARGE:  04/06/2014                              DISCHARGE SUMMARY   ADMITTING DIAGNOSES: 1. New onset angina, rule out coronary insufficiency. 2. Hypertension. 3. Diabetes mellitus. 4. Hypercholesteremia. 5. History of bronchial asthma. 6. Morbid obesity.  CANCELLED DICTATION.     Allegra Lai. Terrence Dupont, M.D.     MNH/MEDQ  D:  04/06/2014  T:  04/07/2014  Job:  [id number]

## 2014-04-07 NOTE — Discharge Summary (Signed)
NAMECYANN, VENTI              ACCOUNT NO.:  [id number]  MEDICAL RECORD NO.:  [id number]  LOCATION:  6C07C                        FACILITY:  [facility]  PHYSICIAN:  Kimberley Dastrup N. Terrence Dupont, M.D. DATE OF BIRTH:  [birth year]  DATE OF ADMISSION:  04/05/2014 DATE OF DISCHARGE:  04/06/2014                              DISCHARGE SUMMARY   ADMITTING DIAGNOSES: 1. New-onset angina, rule out progression of coronary artery disease. 2. Hypertension. 3. Type 2 diabetes mellitus. 4. Hypercholesteremia. 5. History of bronchial asthma. 6. Morbid obesity.  DISCHARGE DIAGNOSES: 1. New-onset angina, status post left cardiac     catheterization/percutaneous transluminal coronary angioplasty     stenting to proximal left anterior descending artery. 2. Hypertension. 3. Type 2 diabetes mellitus. 4. Hypercholesteremia. 5. History of bronchial asthma. 6. Morbid obesity.  DISCHARGE HOME MEDICATIONS: 1. Albuterol inhaler 2 puffs every 6 hours as needed as before. 2. Amlodipine 5 mg 1 tablet daily. 3. Aspirin 81 mg 1 tablet daily. 4. Prasugrel 10 mg 1 tablet daily. 5. Losartan 100 mg daily. 6. Nitrostat sublingual use as directed. 7. Omega-3 fish oil 1000 mg twice daily. 8. Crestor 5 mg daily. 9. NovoLog insulin 4-6 units sliding scale as before. 10.Metformin 500 mg twice daily as before. 11.Singulair 10 mg 1 tablet daily.  DIET:  Low-salt, low-cholesterol, 1800 calories ADA diet.  CONDITION AT DISCHARGE:  Stable.  Post-PTCA stent instructions have been given.  The patient will be scheduled for phase 2 cardiac rehab as outpatient.  BRIEF HISTORY AND HOSPITAL COURSE:  Ms. Strohecker is a 64 year old female with past medical history significant for hypertension, type 2 diabetes mellitus, hypercholesteremia, history of bronchial asthma, morbid obesity, she came to office complaining of retrosternal chest pain described as band across the chest associated with tightness, grade 5 or 10, associated with  dyspnea and diaphoresis with minimal exertion, relieves with rest.  The patient denies any nausea, vomiting.  Denies any PND, orthopnea or leg swelling.  EKG done in my office showed normal sinus rhythm with minor ST-T wave changes in inferior leads.  Due to typical anginal chest pain, multiple risk factors, discussed with the patient varies options of treatment, i.e., noninvasive stress testing versus left cath, possible PTCA, stenting, its risks and benefits, and consented for PCI.  PHYSICAL EXAMINATION:  GENERAL:  She was alert, awake, oriented x3 in no acute distress, hemodynamically stable. HEENT:  Conjunctiva was pink. NECK:  Supple.  No JVD.  No bruit. LUNGS:  Clear to auscultation without rhonchi or rales. CARDIOVASCULAR:  S1, S2 was normal.  There was soft systolic murmur.  No S3, gallop. ABDOMEN:  Soft.  Bowel sounds were present.  Nontender. EXTREMITIES:  There was no clubbing, cyanosis, or edema.  LABORATORY DATA:  Post-PCI, her sodium is 138, potassium is 3.1, which is replaced.  BUN is 8, creatinine 0.59, glucose was 135.  Hemoglobin 11.7, hematocrit 35.4, white count of 6.5.  Troponin-I was minimally elevated at 0.12 and repeat troponin-I was 0.07.  EKG done today showed normal sinus rhythm with no acute ischemic changes.  BRIEF HOSPITAL COURSE:  The patient was a.m. admit and underwent left cardiac cath with selective left and right coronary angiography, left ventriculography and PTCA  stenting to proximal LAD with excellent results.  The patient tolerated the procedure well.  There were no complications.  Postprocedure, the patient did not have any anginal chest pain, but complained of occasional hiccup and had episode of bradycardia.  Her beta-blockers were discontinued.  The patient did not have any episodes of anginal chest pain during the hospital stay.  Her groin is stable with no evidence of hematoma or bruit.  Phase 1 cardiac rehab was called.  The patient  ambulated in the hallway today without any chest pain, states feels much better and breathing has markedly improved.  The patient will be discharged home on above medications and will be followed up in my office in 1 week and she will be scheduled for phase 2 cardiac rehab as outpatient.  The patient has been extensively counseled regarding diet, exercise, lifestyle changes and compliance with medication and followup.     Allegra Lai. Terrence Dupont, M.D.     MNH/MEDQ  D:  04/06/2014  T:  04/07/2014  Job:  [id number]

## 2014-04-21 ENCOUNTER — Encounter (HOSPITAL_COMMUNITY): Payer: Self-pay | Admitting: Nurse Practitioner

## 2014-04-21 ENCOUNTER — Emergency Department (HOSPITAL_COMMUNITY)
Admission: EM | Admit: 2014-04-21 | Discharge: 2014-04-22 | Disposition: A | Discharge status: Home / Self Care | Payer: Medicare HMO | Attending: Emergency Medicine | Admitting: Emergency Medicine

## 2014-04-21 DIAGNOSIS — E669 Obesity, unspecified: Secondary | ICD-10-CM | POA: Diagnosis not present

## 2014-04-21 DIAGNOSIS — Z9861 Coronary angioplasty status: Secondary | ICD-10-CM | POA: Insufficient documentation

## 2014-04-21 DIAGNOSIS — Z7982 Long term (current) use of aspirin: Secondary | ICD-10-CM | POA: Insufficient documentation

## 2014-04-21 DIAGNOSIS — Z79899 Other long term (current) drug therapy: Secondary | ICD-10-CM | POA: Insufficient documentation

## 2014-04-21 DIAGNOSIS — R58 Hemorrhage, not elsewhere classified: Secondary | ICD-10-CM | POA: Diagnosis present

## 2014-04-21 DIAGNOSIS — Z8659 Personal history of other mental and behavioral disorders: Secondary | ICD-10-CM | POA: Diagnosis not present

## 2014-04-21 DIAGNOSIS — E119 Type 2 diabetes mellitus without complications: Secondary | ICD-10-CM | POA: Diagnosis not present

## 2014-04-21 DIAGNOSIS — I10 Essential (primary) hypertension: Secondary | ICD-10-CM | POA: Insufficient documentation

## 2014-04-21 DIAGNOSIS — R6884 Jaw pain: Secondary | ICD-10-CM | POA: Diagnosis not present

## 2014-04-21 DIAGNOSIS — R011 Cardiac murmur, unspecified: Secondary | ICD-10-CM | POA: Diagnosis not present

## 2014-04-21 DIAGNOSIS — J45909 Unspecified asthma, uncomplicated: Secondary | ICD-10-CM | POA: Insufficient documentation

## 2014-04-21 DIAGNOSIS — Z794 Long term (current) use of insulin: Secondary | ICD-10-CM | POA: Insufficient documentation

## 2014-04-21 DIAGNOSIS — R233 Spontaneous ecchymoses: Secondary | ICD-10-CM

## 2014-04-21 DIAGNOSIS — E785 Hyperlipidemia, unspecified: Secondary | ICD-10-CM | POA: Diagnosis not present

## 2014-04-21 DIAGNOSIS — Z8719 Personal history of other diseases of the digestive system: Secondary | ICD-10-CM | POA: Diagnosis not present

## 2014-04-21 DIAGNOSIS — R238 Other skin changes: Secondary | ICD-10-CM

## 2014-04-21 LAB — CBC WITH DIFFERENTIAL/PLATELET
Basophils Absolute: 0.1 10*3/uL (ref 0.0–0.1)
Basophils Relative: 1 % (ref 0–1)
EOS ABS: 0.2 10*3/uL (ref 0.0–0.7)
Eosinophils Relative: 2 % (ref 0–5)
HCT: 38.7 % (ref 36.0–46.0)
Hemoglobin: 13.2 g/dL (ref 12.0–15.0)
LYMPHS PCT: 47 % — AB (ref 12–46)
Lymphs Abs: 3.3 10*3/uL (ref 0.7–4.0)
MCH: 28.1 pg (ref 26.0–34.0)
MCHC: 34.1 g/dL (ref 30.0–36.0)
MCV: 82.5 fL (ref 78.0–100.0)
Monocytes Absolute: 0.9 10*3/uL (ref 0.1–1.0)
Monocytes Relative: 12 % (ref 3–12)
Neutro Abs: 2.6 10*3/uL (ref 1.7–7.7)
Neutrophils Relative %: 38 % — ABNORMAL LOW (ref 43–77)
Platelets: 331 10*3/uL (ref 150–400)
RBC: 4.69 MIL/uL (ref 3.87–5.11)
RDW: 14.1 % (ref 11.5–15.5)
WBC: 7 10*3/uL (ref 4.0–10.5)

## 2014-04-21 LAB — BASIC METABOLIC PANEL
Anion gap: 8 (ref 5–15)
BUN: 9 mg/dL (ref 6–23)
CO2: 28 mmol/L (ref 19–32)
CREATININE: 0.6 mg/dL (ref 0.50–1.10)
Calcium: 9.1 mg/dL (ref 8.4–10.5)
Chloride: 104 mmol/L (ref 96–112)
GFR calc Af Amer: >90 mL/min (ref >90)
GFR calc non Af Amer: >90 mL/min (ref >90)
Glucose, Bld: 76 mg/dL (ref 70–99)
POTASSIUM: 3.4 mmol/L — AB (ref 3.5–5.1)
SODIUM: 140 mmol/L (ref 135–145)

## 2014-04-21 LAB — PROTIME-INR
INR: 1.04 (ref 0.00–1.49)
PROTHROMBIN TIME: 13.7 s (ref 11.6–15.2)

## 2014-04-21 LAB — URINALYSIS, ROUTINE W REFLEX MICROSCOPIC
Bilirubin Urine: NEGATIVE
GLUCOSE, UA: NEGATIVE mg/dL
Hgb urine dipstick: NEGATIVE
Ketones, ur: NEGATIVE mg/dL
Leukocytes, UA: NEGATIVE
NITRITE: NEGATIVE
PROTEIN: NEGATIVE mg/dL
Specific Gravity, Urine: 1.01 (ref 1.005–1.030)
Urobilinogen, UA: 0.2 mg/dL (ref 0.0–1.0)
pH: 7.5 (ref 5.0–8.0)

## 2014-04-21 LAB — I-STAT TROPONIN, ED
TROPONIN I, POC: 0 ng/mL (ref 0.00–0.08)
Troponin i, poc: 0 ng/mL (ref 0.00–0.08)
Troponin i, poc: 0 ng/mL (ref 0.00–0.08)

## 2014-04-21 LAB — CBG MONITORING, ED
Glucose-Capillary: 74 mg/dL (ref 70–99)
Glucose-Capillary: 140 mg/dL — ABNORMAL HIGH (ref 70–99)

## 2014-04-21 LAB — TYPE AND SCREEN
ABO/RH(D): O POS
ANTIBODY SCREEN: NEGATIVE

## 2014-04-21 LAB — POC OCCULT BLOOD, ED: FECAL OCCULT BLD: NEGATIVE

## 2014-04-21 LAB — ABO/RH: ABO/RH(D): O POS

## 2014-04-21 LAB — APTT: aPTT: 28 seconds (ref 24–37)

## 2014-04-21 MED ORDER — NITROGLYCERIN 0.4 MG SL SUBL
0.4000 mg | SUBLINGUAL_TABLET | SUBLINGUAL | Status: AC | PRN
  Administered 2014-04-21 (×3): 0.4 mg via SUBLINGUAL
  Filled 2014-04-21 (×3): qty 1

## 2014-04-21 MED ORDER — NITROGLYCERIN 2 % TD OINT
1.0000 [in_us] | TOPICAL_OINTMENT | Freq: Once | TRANSDERMAL | Status: AC
  Administered 2014-04-21: 1 [in_us] via TOPICAL
  Filled 2014-04-21: qty 1

## 2014-04-21 MED ORDER — ASPIRIN 325 MG PO TABS
325.0000 mg | ORAL_TABLET | Freq: Once | ORAL | Status: AC
  Administered 2014-04-21: 325 mg via ORAL
  Filled 2014-04-21: qty 1

## 2014-04-21 NOTE — ED Notes (Signed)
Pt began experiencing "shakiness and dizziness". Feels "like my sugar is low". MD made aware, CBG = 74. MD advised to give PO CHO.  RN gave 8oz orange juice. Pt visibly diaphoretic. Vitals stable. Will recheck sugar in 15 mins.

## 2014-04-21 NOTE — Consult Note (Signed)
CARDIOLOGY CONSULT NOTE   Kristi Bennett MRN: [id number] DOB/AGE: Feb 23, 1950 64 y.o. Admit date: 04/21/2014   Primary Cardiologist: Terrence Dupont   Reason for consultation:  Jaw pain  HPI:  64 yo female with history of DM, HTN, HLD and CAD s/p LAD PCI just 2 weeks ago on ASA and Effient who initially presented to ER for concerns of abdominal bruises and questionable vaginal bleed. The ER physician examined above and had no concerning findings. Then patient states she is hypoglycemia (BG 87) and having some Jaw pain without chest pain. Her ECG remain same as post PCI, Troponin x3 negative, and her vital signs stable without any arrhythmia.   She looks comfortable in bed during my interview and her biggest concern was her metformin dose adjustment. Her right femoral access looked normal without bruises or hematoma.   She states she has been taking all her medications as instructed, especially ASA and Effient.   Review of systems: A review of 10 organ systems was done and is negative except as stated above in HPI  Past Medical History  Diagnosis Date  . Asthma   . Hypertension   . Allergic rhinitis   . Sinusitis   . Hyperlipidemia   . Obesity   . GERD (gastroesophageal reflux disease)   . Depression   . Memory impairment   . Heart murmur   . Diabetes mellitus without complication    Past Surgical History  Procedure Laterality Date  . Tubal ligation    . Breast lumpectomy    . Hernia repair    . Leg surgery    . Abdominal hysterectomy    . Left heart catheterization with coronary angiogram N/A 04/05/2014    Procedure: LEFT HEART CATHETERIZATION WITH CORONARY ANGIOGRAM;  Surgeon: Clent Demark, MD;  Location: [facility] CATH LAB;  Service: Cardiovascular;  Laterality: N/A;   History   Social History  . Marital Status: Divorced    Spouse Name: N/A  . Number of Children: N/A  . Years of Education: N/A   Occupational History  . Not on file.   Social History Main Topics  .  Smoking status: Never Smoker   . Smokeless tobacco: Never Used  . Alcohol Use: Yes     Comment: glass of wine once or twice a month  . Drug Use: No  . Sexual Activity: Not on file   Other Topics Concern  . Not on file   Social History Narrative    Family History  Problem Relation Age of Onset  . Hypertension Other   . Coronary artery disease Other      Allergies  Allergen Reactions  . Avapro [Irbesartan] Anaphylaxis  . Avelox [Moxifloxacin] Anaphylaxis  . Excedrin Extra Strength [Aspirin-Acetaminophen-Caffeine] Anaphylaxis  . Lipitor [Atorvastatin] Shortness Of Breath and Other (See Comments)    Severe muscle cramps, heart palpitations     (Not in a hospital admission)  No current facility-administered medications for this encounter.  Current outpatient prescriptions:  .  albuterol (PROVENTIL HFA;VENTOLIN HFA) 108 (90 BASE) MCG/ACT inhaler, Inhale 2 puffs into the lungs every 6 (six) hours as needed for wheezing or shortness of breath. , Disp: , Rfl:  .  amLODipine (NORVASC) 5 MG tablet, Take 1 tablet (5 mg total) by mouth daily., Disp: 30 tablet, Rfl: 3 .  aspirin EC 81 MG tablet, Take 81 mg by mouth daily at 2 PM daily at 2 PM., Disp: , Rfl:  .  insulin aspart (NOVOLOG) 100 UNIT/ML injection, Inject  4-6 Units into the skin 2 (two) times daily as needed for high blood sugar (CBG >140)., Disp: , Rfl:  .  losartan (COZAAR) 100 MG tablet, Take 100 mg by mouth daily at 2 PM daily at 2 PM. , Disp: , Rfl:  .  metFORMIN (GLUCOPHAGE) 500 MG tablet, Take 1 tablet (500 mg total) by mouth 2 (two) times daily with a meal., Disp: 60 tablet, Rfl: 3 .  montelukast (SINGULAIR) 10 MG tablet, Take 10 mg by mouth at bedtime., Disp: , Rfl:  .  nitroGLYCERIN (NITROSTAT) 0.4 MG SL tablet, Place 1 tablet (0.4 mg total) under the tongue every 5 (five) minutes as needed for chest pain., Disp: 25 tablet, Rfl: 12 .  Omega-3 Fatty Acids (FISH OIL) 1000 MG CAPS, Take 1,000 mg by mouth 2 (two) times  daily., Disp: , Rfl:  .  prasugrel (EFFIENT) 10 MG TABS tablet, Take 1 tablet (10 mg total) by mouth daily., Disp: 30 tablet, Rfl: 11 .  rosuvastatin (CRESTOR) 5 MG tablet, Take 1 tablet (5 mg total) by mouth daily at 6 PM., Disp: 30 tablet, Rfl: 3  Physical Exam: Blood pressure 150/64, pulse 64, temperature 98.2 F (36.8 C), temperature source Oral, resp. rate 13, SpO2 98 %.; There is no weight on file to calculate BMI. Temp:  [98.2 F (36.8 C)] 98.2 F (36.8 C) (03/05 1458) Pulse Rate:  [52-70] 64 (03/05 2200) Resp:  [12-24] 13 (03/05 2200) BP: (117-162)/(48-72) 150/64 mmHg (03/05 2200) SpO2:  [98 %-100 %] 98 % (03/05 2200)  No intake or output data in the 24 hours ending 04/21/14 2241 General: NAD Heent: MMM Neck: No JVD  CV: Nondisplaced PMI.  RRR, nl S1/S2, no S3/S4, no murmur. No carotid bruit   Lungs: Clear to auscultation bilaterally with normal respiratory effort Abdomen: Soft, nontender, nondistended Extremities: No clubbing or cyanosis.  Normal pedal pulses. No pedal edema Skin: Intact without lesions or rashes  Neurologic: Alert and oriented x 3, grossly nonfocal  Psych: Normal mood and affect    Labs: No results for input(s): CKTOTAL, CKMB, TROPONINI in the last 72 hours. Lab Results  Component Value Date   WBC 7.0 04/21/2014   HGB 13.2 04/21/2014   HCT 38.7 04/21/2014   MCV 82.5 04/21/2014   PLT 331 04/21/2014    Recent Labs Lab 04/21/14 1518  NA 140  K 3.4*  CL 104  CO2 28  BUN 9  CREATININE 0.60  CALCIUM 9.1  GLUCOSE 76   No results found for: CHOL, HDL, LDLCALC, TRIG     EKG:  Sinus rhytham with nonspecific ST changes, NO Changes compared to ECG on 04/06/14.  Radiology:  No results found.  ASSESSMENT:  64 yo female with history of DM, HTN, HLD and CAD s/p LAD PCI just 2 weeks ago on ASA and Effient who initially presented to ER for concerns of abdominal bruises and questionable vaginal bleed, and then developed atypical pain during  "hypoglycemia"  Episode  1. Atypical jaw pain, without evidence of ACS, troponin elevation, or in-stent thrombosis  PLAN:  1. Observe and rule out ACS with 3 x Troponin 2. Continue all cardiac meds, including Effient and ASA 3. If asymptomatic and no new ECG changes and no Troponin elevation, OK to discharge from cardiac prospective and follow up with Dr. Terrence Dupont as scheduled.  4. Instructions should be given to patient to come to ER if develops chest pain.    Thank you for this consultation.  Signed: Manus Gunning, MD Cardiology Fellow 04/21/2014, 10:41 PM

## 2014-04-21 NOTE — ED Notes (Signed)
CBG 74  

## 2014-04-21 NOTE — ED Notes (Addendum)
She had a stent placed here on 2/18 and started on effient.  She noticed when she woke today there was blood in her underwear and bruising on her legs arms and trunk. Denies headache, pain, dizziness. She is A&Ox4. She feels the bleeding is coming from her vagina

## 2014-04-21 NOTE — ED Provider Notes (Signed)
CSN: [id number]     Arrival date & time 04/21/14  1426 History   First MD Initiated Contact with Patient 04/21/14 1605     Chief Complaint  Patient presents with  . Bleeding/Bruising     (Consider location/radiation/quality/duration/timing/severity/associated sxs/prior Treatment) HPI Comments: Patient from home for concern of bruising and one episode of bright red spotting and underwear.  After arrival.  She states she had onset of 5 out of 10 left-sided jaw pain without radiation.  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    Past Medical History  Diagnosis Date  . Asthma   . Hypertension   . Allergic rhinitis   . Sinusitis   . Hyperlipidemia   . Obesity   . GERD (gastroesophageal reflux disease)   . Depression   . Memory impairment   . Heart murmur   . Diabetes mellitus without complication    Past Surgical History  Procedure Laterality Date  . Tubal ligation    . Breast lumpectomy    . Hernia repair    . Leg surgery    . Abdominal hysterectomy    . Left heart catheterization with coronary angiogram N/A 04/05/2014    Procedure: LEFT HEART CATHETERIZATION WITH CORONARY ANGIOGRAM;  Surgeon: Clent Demark, MD;  Location: [facility] CATH LAB;  Service: Cardiovascular;  Laterality: N/A;   Family History  Problem Relation Age of Onset  . Hypertension Other   . Coronary artery disease Other    History  Substance Use Topics  . Smoking status: Never Smoker   . Smokeless tobacco: Never Used  . Alcohol Use: Yes     Comment: glass of wine once or twice a month   OB History    No data available     Review of Systems  Constitutional: Negative for fever, chills, diaphoresis, activity change, appetite change and fatigue.  HENT: Negative for congestion, facial swelling, rhinorrhea and sore throat.   Eyes: Negative for photophobia and discharge.  Respiratory: Negative for cough, chest tightness and shortness of breath.   Cardiovascular: Negative for chest  pain, palpitations and leg swelling.  Gastrointestinal: Negative for nausea, vomiting, abdominal pain and diarrhea.       Blood in underwear after urinating   Endocrine: Negative for polydipsia and polyuria.  Genitourinary: Negative for dysuria, frequency, difficulty urinating and pelvic pain.  Musculoskeletal: Negative for back pain, arthralgias, neck pain and neck stiffness.  Skin: Negative for color change and wound.  Allergic/Immunologic: Negative for immunocompromised state.  Neurological: Negative for facial asymmetry, weakness, numbness and headaches.  Hematological: Bruises/bleeds easily.  Psychiatric/Behavioral: Negative for confusion and agitation.      Allergies  Avapro; Avelox; Excedrin extra strength; and Lipitor  Home Medications   Prior to Admission medications   Medication Sig Start Date End Date Taking? Authorizing Provider  albuterol (PROVENTIL HFA;VENTOLIN HFA) 108 (90 BASE) MCG/ACT inhaler Inhale 2 puffs into the lungs every 6 (six) hours as needed for wheezing or shortness of breath.    Yes Historical Provider, MD  amLODipine (NORVASC) 5 MG tablet Take 1 tablet (5 mg total) by mouth daily. 04/06/14  Yes Clent Demark, MD  aspirin EC 81 MG tablet Take 81 mg by mouth daily at 2 PM daily at 2 PM.   Yes Historical Provider, MD  insulin aspart (NOVOLOG) 100 UNIT/ML injection Inject 4-6 Units into the skin 2 (two) times daily as needed for high blood sugar (CBG >140).   Yes Historical Provider, MD  losartan (COZAAR) 100 MG tablet Take 100 mg by mouth daily at 2 PM daily at 2 PM.    Yes Historical Provider, MD  metFORMIN (GLUCOPHAGE) 500 MG tablet Take 1 tablet (500 mg total) by mouth 2 (two) times daily with a meal. 04/07/14  Yes Clent Demark, MD  montelukast (SINGULAIR) 10 MG tablet Take 10 mg by mouth at bedtime.   Yes Historical Provider, MD  nitroGLYCERIN (NITROSTAT) 0.4 MG SL tablet Place 1 tablet (0.4 mg total) under the tongue every 5 (five) minutes as needed for  chest pain. 04/06/14  Yes Clent Demark, MD  Omega-3 Fatty Acids (FISH OIL) 1000 MG CAPS Take 1,000 mg by mouth 2 (two) times daily.   Yes Historical Provider, MD  prasugrel (EFFIENT) 10 MG TABS tablet Take 1 tablet (10 mg total) by mouth daily. 04/06/14  Yes Clent Demark, MD  rosuvastatin (CRESTOR) 5 MG tablet Take 1 tablet (5 mg total) by mouth daily at 6 PM. 04/06/14  Yes Clent Demark, MD   BP 126/56 mmHg  Pulse 56  Temp(Src) 98.2 F (36.8 C) (Oral)  Resp 17  SpO2 98% Physical Exam  Constitutional: She is oriented to person, place, and time. She appears well-developed and well-nourished. No distress.  HENT:  Head: Normocephalic and atraumatic.  Mouth/Throat: No oropharyngeal exudate.  Eyes: Pupils are equal, round, and reactive to light.  Neck: Normal range of motion. Neck supple.  Cardiovascular: Normal rate, regular rhythm and normal heart sounds.  Exam reveals no gallop and no friction rub.   No murmur heard. Pulmonary/Chest: Effort normal and breath sounds normal. No respiratory distress. She has no wheezes. She has no rales.  Abdominal: Soft. Bowel sounds are normal. She exhibits no distension and no mass. There is no tenderness. There is no rebound and no guarding.  Genitourinary:  No vaginal cervical bleeding or lesions in the vagina.  Rectal exam unremarkable  Musculoskeletal: Normal range of motion. She exhibits no edema or tenderness.  Neurological: She is alert and oriented to person, place, and time.  Skin: Skin is warm and dry.  1.  Small bruise small bruises on right upper arm.   One small bruise on abdomen.  One small bruise on left lower leg  Psychiatric: She has a normal mood and affect.    ED Course  Procedures (including critical care time) Labs Review Labs Reviewed  CBC WITH DIFFERENTIAL/PLATELET - Abnormal; Notable for the following:    Neutrophils Relative % 38 (*)    Lymphocytes Relative 47 (*)    All other components within normal limits  BASIC  METABOLIC PANEL - Abnormal; Notable for the following:    Potassium 3.4 (*)    All other components within normal limits  CBG MONITORING, ED - Abnormal; Notable for the following:    Glucose-Capillary 140 (*)    All other components within normal limits  APTT  PROTIME-INR  URINALYSIS, ROUTINE W REFLEX MICROSCOPIC  CBG MONITORING, ED  I-STAT TROPOININ, ED  I-STAT TROPOININ, ED  POC OCCULT BLOOD, ED  I-STAT TROPOININ, ED  TYPE AND SCREEN  ABO/RH    Imaging Review No results found.   EKG Interpretation   Date/Time:  Saturday April 21 2014 19:43:50 EST Ventricular Rate:  56 PR Interval:  151 QRS Duration: 84 QT Interval:  465 QTC Calculation: 449 R Axis:   80 Text Interpretation:  Sinus rhythm Consider left atrial enlargement  Probable anteroseptal infarct, old Minimal ST depression, diffuse leads  Confirmed by  DOCHERTY  MD, [place] [phone]) on 04/21/2014 9:00:37 PM      MDM   Final diagnoses:  Jaw pain  Easy bruising    Pt is a 64 y.o. female with Pmhx as above who presents with bruising and concern for vaginal bleeding.  Patient had a heart cath on 04/05/2014, for unstable angina and had one stent placed.  At that time she was placed on Effient.  She states that she has had to take 2 sublingual nitroglycerin since her discharge for chest pain that she describes as rare prickling central pain.  Today after she urinated.  She saw some spots of bright red blood in her underwear.  She has a history of a hysterectomy.  She denies current chest pain, though states that she developed left-sided jaw pain at about 3:20 PM she denies associated nausea, vomiting, diaphoresis, shortness of breath, though has had some dizziness today.  The bruising that she shows me on her abdomen and extremities is very minimal, and she has not otherwise seen blood in her stool, gums or urine.  No bleeding found on physical exam.  INR not elevated.  Hemoglobin is stable.  Serial troponins up to 8 hours  after symptom of jaw pain onset are normal.  She's had no EKG changes.  Cardiology fellow is seen patient in the ED, does not feel that jaw pain represents unstable anginal pain.  Patient will be discharged home and asked to follow-up with her cardiologist, Dr. Loren Racer E Lajara evaluation in the Emergency Department is complete. It has been determined that no acute conditions requiring further emergency intervention are present at this time. The patient/guardian have been advised of the diagnosis and plan. We have discussed signs and symptoms that warrant return to the ED, such as changes or worsening in symptoms, worsening pain, leading, chest pain, shortness of breath.      Ernestina Patches, MD 04/22/14 [phone]

## 2014-04-21 NOTE — ED Notes (Signed)
CBG 140  

## 2014-04-22 NOTE — Discharge Instructions (Signed)
Anticoagulation, Generic Anticoagulants are medicines used to prevent clots from developing in your veins. These medicine are also known as blood thinners. If blood clots are untreated, they could travel to your lungs. This is called a pulmonary embolus. A blood clot in your lungs can be fatal.  Health care providers often use anticoagulants to prevent clots following surgery. Anticoagulants are also used along with aspirin when the heart is not getting enough blood. Another anticoagulant called warfarin is started 2 to 3 days after a rapid-acting injectable anticoagulant is started. The rapid-acting anticoagulants are usually continued until warfarin has begun to work. Your health care provider will judge this length of time by blood tests known as the prothrombin time (PT) and International Normalization Ratio (INR). This means that your blood is at the necessary and best level to prevent clots. RISKS AND COMPLICATIONS  If you have received recent epidural anesthesia, spinal anesthesia, or a spinal tap while receiving anticoagulants, you are at risk for developing a blood clot in or around the spine. This condition could result in long-term or permanent paralysis.  Because anticoagulants thin your blood, severe bleeding may occur from any tissue or organ. Symptoms of the blood being too thin may include:  Bleeding from the nose or gums that does not stop quickly.  Blood in bowel movements which may appear as bright red, dark, or black tarry stools.  Blood in the urine which may appear as pink, red, or brown urine.  Unusual bruising or bruising easily.  A cut that does not stop bleeding within 10 minutes.  Vomiting blood or continuous nausea for more than 1 day.  Coughing up blood.  Broken blood vessels in your eye (subconjunctival hemorrhage).  Abdominal or back pain with or without flank bruising.  Sudden, severe headache.  Sudden weakness or numbness of the face, arm, or leg,  especially on one side of the body.  Sudden confusion.  Trouble speaking (aphasia) or understanding.  Sudden trouble seeing in one or both eyes.  Sudden trouble walking.  Dizziness.  Loss of balance or coordination.  Vaginal bleeding.  Swelling or pain at an injection site.  Superficial fat tissue death (necrosis) which may cause skin scarring. This is more common in women and may first present as pain in the waist, thighs, or buttocks.  Fever.  Too little anticoagulation continues to allow the risk for blood clots. HOME CARE INSTRUCTIONS   Due to the complications of anticoagulants, it is very important that you take your anticoagulant as directed by your health care provider. Anticoagulants need to be taken exactly as instructed. Be sure you understand all your anticoagulant instructions.  Keep all follow-up appointments with your health care provider as directed. It is very important to keep your appointments. Not keeping appointments could result in a chronic or permanent injury, pain, or disability.  Warfarin. Your health care provider will advise you on the length of treatment (usually 3-6 months, sometimes lifelong).  Take warfarin exactly as directed by your health care provider. It is recommended that you take your warfarin dose at the same time of the day. It is preferred that you take warfarin in the late afternoon. If you have been told to stop taking warfarin, do not resume taking warfarin until directed to do so by your health care provider. Follow your health care provider's instructions if you accidentally take an extra dose or miss a dose of warfarin. It is very important to take warfarin as directed since bleeding or blood  clots could result in chronic or permanent injury, pain, or disability.  Too much and too little warfarin are both dangerous. Too much warfarin increases the risk of bleeding. Too little warfarin continues to allow the risk for blood clots. While  taking warfarin, you will need to have regular blood tests to measure your blood clotting time. These blood tests usually include both the prothrombin time (PT) and International Normalized Ratio (INR) tests. The PT and INR results allow your health care provider to adjust your dose of warfarin. The dose can change for many reasons. It is critically important that you have your PT and INR levels drawn exactly as directed. Your warfarin dose may stay the same or change depending on what the PT and INR results are. Be sure to follow up with your health care provider regarding your PT and INR test results and what your warfarin dosage should be.  Many medicines can interfere with warfarin and affect the PT and INR results. You must tell your health care provider about any and all medicines you take, this includes all vitamins and supplements. Ask your health care provider before taking these. Prescription and over-the-counter medicine consistency is critical to warfarin management. It is important that potential interactions are checked before you start a new medicine. Be especially cautious with aspirin and anti-inflammatory medicines. Ask your health care provider before taking these. Medicines such as antibiotics and acid-reducing medicine can interact with warfarin and can cause an increased warfarin effect. Warfarin can also interfere with the effectiveness of medicines you are taking. Do not take or discontinue any prescribed or over-the-counter medicine except on the advice of your health care provider or pharmacist.  Some vitamins, supplements, and herbal products interfere with the effectiveness of warfarin. Vitamin E may increase the anticoagulant effects of warfarin. Vitamin K may can cause warfarin to be less effective. Do not take or discontinue any vitamin, supplement, or herbal product except on the advice of your health care provider or pharmacist.  Eat what you normally eat and keep the vitamin K  content of your diet consistent. Avoid major changes in your diet, or notify your health care provider before changing your diet. Suddenly getting a lot more vitamin K could cause your blood to clot too quickly. A sudden decrease in vitamin K intake could cause your blood to clot too slowly. These changes in vitamin K intake could lead to dangerous blood clotsor to bleeding. To keep your vitamin K intake consistent, you must be aware of which foods contain moderate or high amounts of vitamin K. Some foods high in vitamin K include spinach, kale, broccoli, cabbage, greens, Brussels sprouts, asparagus, Bok [place]oy, coleslaw, parsley, and green tea. Arrange a visit with a dietitian to answer your questions.  If you have a loss of appetite or get the stomach flu (viral gastroenteritis), talk to your health care provider as soon as possible. A decrease in your normal vitamin K intake can make you more sensitive to your usual dose of warfarin.  Some medical conditions may increase your risk for bleeding while you are taking warfarin. A fever, diarrhea lasting more than a day, worsening heart failure, or worsening liver function are some medical conditions that could affect warfarin. Contact your health care provider if you have any of these medical conditions.  Alcohol can change the body's ability to handle warfarin. It is best to avoid alcoholic drinks or consume only very small amounts while taking warfarin. Notify your health care provider if  you change your alcohol intake. A sudden increase in alcohol use can increase your risk of bleeding. Chronic alcohol use can cause warfarin to be less effective.  Be careful not to cut yourself when using sharp objects or while shaving.  Inform all your health care providers and your dentist that you take an anticoagulant.  Limit physical activities or sports that could result in a fall or cause injury. Avoid contact sports.  Wear medical alert jewelry or carry a  medical alert card. SEEK IMMEDIATE MEDICAL CARE IF:  You cough up blood.  You have dark or black stools or there is bright red blood coming from your rectum.  You vomit blood or have nausea for more than 1 day.  You have blood in the urine or pink colored urine.  You have unusual bruising or have increased bruising.  You have bleeding from the nose or gums that does not stop quickly.  You have a cut that does not stop bleeding within a 2-3 minutes.  You have sudden weakness or numbness of the face, arm, or leg, especially on one side of the body.  You have sudden confusion.  You have trouble speaking (aphasia) or understanding.  You have sudden trouble seeing in one or both eyes.  You have sudden trouble walking.  You have dizziness.  You have a loss of balance or coordination.  You have a sudden, severe headache.  You have a serious fall or head injury, even if you are not bleeding.  You have swelling or pain at an injection site.  You have unexplained tenderness or pain in the abdomen, back, waist, thighs or buttocks.  You have a fever. Any of these symptoms may represent a serious problem that is an emergency. Do not wait to see if the symptoms will go away. Get medical help right away. Call your local emergency services (911 in [place] Do not drive yourself to the hospital. Document Released: 02/02/2005 Document Revised: 02/07/2013 Document Reviewed: 09/07/2007 [facility] Patient Information 2015 [place], [place]. This information is not intended to replace advice given to you by your health care provider. Make sure you discuss any questions you have with your health care provider.    Angina Pectoris Angina pectoris, often just called angina, is extreme discomfort in your chest, neck, or arm caused by a lack of blood in the middle and thickest layer of your heart wall (myocardium). It may feel like tightness or heavy pressure. It may feel like a crushing or squeezing  pain. Some people say it feels like gas or indigestion. It may go down your shoulders, back, and arms. Some people may have symptoms other than pain. These symptoms include fatigue, shortness of breath, cold sweats, or nausea. There are four different types of angina:  Stable angina--Stable angina usually occurs in episodes of predictable frequency and duration. It usually is brought on by physical activity, emotional stress, or excitement. These are all times when the myocardium needs more oxygen. Stable angina usually lasts a few minutes and often is relieved by taking a medicine that can be taken under your tongue (sublingually). The medicine is called nitroglycerin. Stable angina is caused by a buildup of plaque inside the arteries, which restricts blood flow to the heart muscle (atherosclerosis).  Unstable angina--Unstable angina can occur even when your body experiences little or no physical exertion. It can occur during sleep. It can also occur at rest. It can suddenly increase in severity or frequency. It might not be relieved by  sublingual nitroglycerin. It can last up to 30 minutes. The most common cause of unstable angina is a blood clot that has developed on the top of plaque buildup inside a coronary artery. It can lead to a heart attack if the blood clot completely blocks the artery.  Microvascular angina--This type of angina is caused by a disorder of tiny blood vessels called arterioles. Microvascular angina is more common in women. The pain may be more severe and last longer than other types of angina pectoris.  Prinzmetal or variant angina--This type of angina pectoris usually occurs when your body experiences little or no physical exertion. It especially occurs in the early morning hours. It is caused by a spasm of your coronary artery. HOME CARE INSTRUCTIONS   Only take over-the-counter and prescription medicines as directed by your health care provider.  Stay active or increase your  exercise as directed by your health care provider.  Limit strenuous activity as directed by your health care provider.  Limit heavy lifting as directed by your health care provider.  Maintain a healthy weight.  Learn about and eat heart-healthy foods.  Do not use any tobacco products including cigarettes, chewing tobacco or electronic cigarettes. SEEK IMMEDIATE MEDICAL CARE IF:  You experience the following symptoms:  Chest, neck, deep shoulder, or arm pain or discomfort that lasts more than a few minutes.  Chest, neck, deep shoulder, or arm pain or discomfort that goes away and comes back, repeatedly.  Heavy sweating with discomfort, without a noticeable cause.  Shortness of breath or difficulty breathing.  Angina that does not get better after a few minutes of rest or after taking sublingual nitroglycerin. These can all be symptoms of a heart attack, which is a medical emergency! Get medical help at once. Call your local emergency service (911 in [place] immediately. Do not  drive yourself to the hospital and do not  wait to for your symptoms to go away. MAKE SURE YOU:  Understand these instructions.  Will watch your condition.  Will get help right away if you are not doing well or get worse. Document Released: 02/02/2005 Document Revised: 02/07/2013 Document Reviewed: 06/06/2013 [facility] Patient Information 2015 [place], [place]. This information is not intended to replace advice given to you by your health care provider. Make sure you discuss any questions you have with your health care provider.

## 2014-04-26 ENCOUNTER — Encounter (HOSPITAL_COMMUNITY)
Admission: RE | Admit: 2014-04-26 | Discharge: 2014-04-26 | Disposition: A | Discharge status: Home / Self Care | Payer: Medicare HMO | Source: Ambulatory Visit | Attending: Cardiology | Admitting: Cardiology

## 2014-04-26 DIAGNOSIS — Z955 Presence of coronary angioplasty implant and graft: Secondary | ICD-10-CM | POA: Insufficient documentation

## 2014-04-26 DIAGNOSIS — Z48812 Encounter for surgical aftercare following surgery on the circulatory system: Secondary | ICD-10-CM | POA: Insufficient documentation

## 2014-04-26 NOTE — Progress Notes (Signed)
Cardiac Rehab Medication Review by a Pharmacist  Does the patient  feel that his/her medications are working for him/her?  yes  Has the patient been experiencing any side effects to the medications prescribed?  Yes - patient is experiencing a lot of bruising on her arms and legs  Does the patient measure his/her own blood pressure or blood glucose at home?  yes   Does the patient have any problems obtaining medications due to transportation or finances?   no  Understanding of regimen: good Understanding of indications: good Potential of compliance: good   Pharmacist comments: Patient is experiencing bruising on her arms and legs likely due to Effient.  She said she has an appointment with Dr. Terrence Dupont today.  She was in the ED this past Saturday with bleeding, but states they could not find a source.  She has no barriers to obtaining her medications, and she takes her medications everyday as prescribed. No questions for me today.  Lisbet Busker L. Nicole Kindred, PharmD Clinical Pharmacy Resident Pager: [phone] 04/26/2014 8:42 AM

## 2014-05-02 ENCOUNTER — Encounter (HOSPITAL_COMMUNITY): Payer: Self-pay

## 2014-05-02 ENCOUNTER — Encounter (HOSPITAL_COMMUNITY)
Admission: RE | Admit: 2014-05-02 | Discharge: 2014-05-02 | Disposition: A | Discharge status: Home / Self Care | Payer: Medicare HMO | Source: Ambulatory Visit | Attending: Cardiology | Admitting: Cardiology

## 2014-05-02 DIAGNOSIS — Z48812 Encounter for surgical aftercare following surgery on the circulatory system: Secondary | ICD-10-CM | POA: Diagnosis not present

## 2014-05-02 DIAGNOSIS — Z955 Presence of coronary angioplasty implant and graft: Secondary | ICD-10-CM | POA: Diagnosis not present

## 2014-05-02 LAB — GLUCOSE, CAPILLARY
Glucose-Capillary: 179 mg/dL — ABNORMAL HIGH (ref 70–99)
Glucose-Capillary: 97 mg/dL (ref 70–99)
Glucose-Capillary: 107 mg/dL — ABNORMAL HIGH (ref 70–99)

## 2014-05-02 NOTE — Progress Notes (Signed)
Pt started cardiac rehab today.  Pt tolerated light exercise without difficulty.  VSS,telemetry-sinus rhythm, asymptomatic.  PHQ-1. Pt does admit to some days of sadness associated with her surgery, on these days she is able to self motivate and lighten her mood. Pt demonstrates positive outlook, good coping skills, strong faith base, and supportive family.  Pt enjoys reading, sewing, church activities and movies.  Pt goals for cardiac rehab are  continued weight loss and learn heart healthy lifestyle modifications.  Pt oriented to exercise equipment and routine.  Understanding verbalized.

## 2014-05-04 ENCOUNTER — Encounter (HOSPITAL_COMMUNITY)
Admission: RE | Admit: 2014-05-04 | Discharge: 2014-05-04 | Disposition: A | Discharge status: Home / Self Care | Payer: Medicare HMO | Source: Ambulatory Visit | Attending: Cardiology | Admitting: Cardiology

## 2014-05-04 DIAGNOSIS — Z48812 Encounter for surgical aftercare following surgery on the circulatory system: Secondary | ICD-10-CM | POA: Diagnosis not present

## 2014-05-04 LAB — GLUCOSE, CAPILLARY
Glucose-Capillary: 156 mg/dL — ABNORMAL HIGH (ref 70–99)
Glucose-Capillary: 127 mg/dL — ABNORMAL HIGH (ref 70–99)

## 2014-05-09 ENCOUNTER — Encounter (HOSPITAL_COMMUNITY)
Admission: RE | Admit: 2014-05-09 | Discharge: 2014-05-09 | Disposition: A | Discharge status: Home / Self Care | Payer: Medicare HMO | Source: Ambulatory Visit | Attending: Cardiology | Admitting: Cardiology

## 2014-05-09 DIAGNOSIS — Z48812 Encounter for surgical aftercare following surgery on the circulatory system: Secondary | ICD-10-CM | POA: Diagnosis not present

## 2014-05-09 LAB — GLUCOSE, CAPILLARY: Glucose-Capillary: 142 mg/dL — ABNORMAL HIGH (ref 70–99)

## 2014-05-10 LAB — GLUCOSE, CAPILLARY: Glucose-Capillary: 171 mg/dL — ABNORMAL HIGH (ref 70–99)

## 2014-05-11 ENCOUNTER — Encounter (HOSPITAL_COMMUNITY)
Admission: RE | Admit: 2014-05-11 | Discharge: 2014-05-11 | Disposition: A | Discharge status: Home / Self Care | Payer: Medicare HMO | Source: Ambulatory Visit | Attending: Cardiology | Admitting: Cardiology

## 2014-05-11 DIAGNOSIS — Z48812 Encounter for surgical aftercare following surgery on the circulatory system: Secondary | ICD-10-CM | POA: Diagnosis not present

## 2014-05-11 LAB — GLUCOSE, CAPILLARY: Glucose-Capillary: 177 mg/dL — ABNORMAL HIGH (ref 70–99)

## 2014-05-11 NOTE — Progress Notes (Signed)
Home exercise reviewed by Shirlyn Goltz, academic intern, with patient. Reviewed home exercise guidelines with patient including endpoints, temperature precautions, target heart rate and rate of perceived exertion. [facility] 30 minutes a day as her mode of home exercise. Pt would like to return to water aerobics at some point as well. Pt voices understanding of instructions given. Sol Passer, MS, ACSM CCEP

## 2014-05-16 ENCOUNTER — Encounter (HOSPITAL_COMMUNITY): Payer: Medicare HMO

## 2014-05-18 ENCOUNTER — Encounter (HOSPITAL_COMMUNITY)
Admission: RE | Admit: 2014-05-18 | Discharge: 2014-05-18 | Disposition: A | Discharge status: Home / Self Care | Payer: Medicare HMO | Source: Ambulatory Visit | Attending: Cardiology | Admitting: Cardiology

## 2014-05-18 DIAGNOSIS — Z955 Presence of coronary angioplasty implant and graft: Secondary | ICD-10-CM | POA: Diagnosis not present

## 2014-05-18 DIAGNOSIS — Z48812 Encounter for surgical aftercare following surgery on the circulatory system: Secondary | ICD-10-CM | POA: Diagnosis not present

## 2014-05-18 NOTE — Progress Notes (Signed)
PSYCHOSOCIAL ASSESSMENT  Pt psychosocial assessment reveals no barriers to rehab participation.  Pt quality of life is slightly altered by her physical constraints which limits her ability to perform tasks as prior to her illness.  Currently pt has transportation barriers which are temporary and pt was able to find alternative methods.  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   Although overall pt exhibits positive coping skills,  has supportive family and strong faith base.  Offered emotional support and reassurance.  Will continue to monitor.

## 2014-05-21 LAB — GLUCOSE, CAPILLARY: Glucose-Capillary: 189 mg/dL — ABNORMAL HIGH (ref 70–99)

## 2014-05-23 ENCOUNTER — Encounter (HOSPITAL_COMMUNITY)
Admission: RE | Admit: 2014-05-23 | Discharge: 2014-05-23 | Disposition: A | Discharge status: Home / Self Care | Payer: Medicare HMO | Source: Ambulatory Visit | Attending: Cardiology | Admitting: Cardiology

## 2014-05-23 DIAGNOSIS — Z48812 Encounter for surgical aftercare following surgery on the circulatory system: Secondary | ICD-10-CM | POA: Diagnosis not present

## 2014-05-25 ENCOUNTER — Encounter (HOSPITAL_COMMUNITY)
Admission: RE | Admit: 2014-05-25 | Discharge: 2014-05-25 | Disposition: A | Discharge status: Home / Self Care | Payer: Medicare HMO | Source: Ambulatory Visit | Attending: Cardiology | Admitting: Cardiology

## 2014-05-25 DIAGNOSIS — Z48812 Encounter for surgical aftercare following surgery on the circulatory system: Secondary | ICD-10-CM | POA: Diagnosis not present

## 2014-05-25 LAB — GLUCOSE, CAPILLARY: Glucose-Capillary: 158 mg/dL — ABNORMAL HIGH (ref 70–99)

## 2014-05-30 ENCOUNTER — Encounter (HOSPITAL_COMMUNITY)
Admission: RE | Admit: 2014-05-30 | Discharge: 2014-05-30 | Disposition: A | Discharge status: Home / Self Care | Payer: Medicare HMO | Source: Ambulatory Visit | Attending: Cardiology | Admitting: Cardiology

## 2014-05-30 DIAGNOSIS — Z48812 Encounter for surgical aftercare following surgery on the circulatory system: Secondary | ICD-10-CM | POA: Diagnosis not present

## 2014-06-01 ENCOUNTER — Encounter (HOSPITAL_COMMUNITY)
Admission: RE | Admit: 2014-06-01 | Discharge: 2014-06-01 | Disposition: A | Discharge status: Home / Self Care | Payer: Medicare HMO | Source: Ambulatory Visit | Attending: Cardiology | Admitting: Cardiology

## 2014-06-01 DIAGNOSIS — Z48812 Encounter for surgical aftercare following surgery on the circulatory system: Secondary | ICD-10-CM | POA: Diagnosis not present

## 2014-06-01 LAB — GLUCOSE, CAPILLARY
GLUCOSE-CAPILLARY: 138 mg/dL — AB (ref 70–99)
Glucose-Capillary: 104 mg/dL — ABNORMAL HIGH (ref 70–99)

## 2014-06-06 ENCOUNTER — Encounter (HOSPITAL_COMMUNITY)
Admission: RE | Admit: 2014-06-06 | Discharge: 2014-06-06 | Disposition: A | Discharge status: Home / Self Care | Payer: Medicare HMO | Source: Ambulatory Visit | Attending: Cardiology | Admitting: Cardiology

## 2014-06-06 DIAGNOSIS — Z48812 Encounter for surgical aftercare following surgery on the circulatory system: Secondary | ICD-10-CM | POA: Diagnosis not present

## 2014-06-06 LAB — GLUCOSE, CAPILLARY: Glucose-Capillary: 157 mg/dL — ABNORMAL HIGH (ref 70–99)

## 2014-06-08 ENCOUNTER — Telehealth (HOSPITAL_COMMUNITY): Payer: Self-pay | Admitting: Cardiac Rehabilitation

## 2014-06-08 ENCOUNTER — Telehealth (HOSPITAL_COMMUNITY): Payer: Self-pay | Admitting: Internal Medicine

## 2014-06-08 ENCOUNTER — Encounter (HOSPITAL_COMMUNITY): Payer: Medicare HMO

## 2014-06-08 NOTE — Telephone Encounter (Signed)
Phone message received from pt will be absent from cardiac rehab today due to back pain.

## 2014-06-13 ENCOUNTER — Telehealth (HOSPITAL_COMMUNITY): Payer: Self-pay | Admitting: Internal Medicine

## 2014-06-13 ENCOUNTER — Encounter (HOSPITAL_COMMUNITY): Payer: Medicare HMO

## 2014-06-15 ENCOUNTER — Telehealth (HOSPITAL_COMMUNITY): Payer: Self-pay | Admitting: Cardiac Rehabilitation

## 2014-06-15 ENCOUNTER — Encounter (HOSPITAL_COMMUNITY): Payer: Medicare HMO

## 2014-06-15 NOTE — Telephone Encounter (Signed)
pc received from pt reporting absence from cardiac rehab this week due to back pain.  Pt plans to return to rehab next week. This is chronic pain.

## 2014-06-20 ENCOUNTER — Encounter (HOSPITAL_COMMUNITY)
Admission: RE | Admit: 2014-06-20 | Discharge: 2014-06-20 | Disposition: A | Discharge status: Home / Self Care | Payer: Medicare HMO | Source: Ambulatory Visit | Attending: Cardiology | Admitting: Cardiology

## 2014-06-20 DIAGNOSIS — Z955 Presence of coronary angioplasty implant and graft: Secondary | ICD-10-CM | POA: Insufficient documentation

## 2014-06-20 DIAGNOSIS — Z48812 Encounter for surgical aftercare following surgery on the circulatory system: Secondary | ICD-10-CM | POA: Insufficient documentation

## 2014-06-21 LAB — GLUCOSE, CAPILLARY: Glucose-Capillary: 120 mg/dL — ABNORMAL HIGH (ref 70–99)

## 2014-06-22 ENCOUNTER — Encounter (HOSPITAL_COMMUNITY)
Admission: RE | Admit: 2014-06-22 | Discharge: 2014-06-22 | Disposition: A | Discharge status: Home / Self Care | Payer: Medicare HMO | Source: Ambulatory Visit | Attending: Cardiology | Admitting: Cardiology

## 2014-06-22 DIAGNOSIS — Z48812 Encounter for surgical aftercare following surgery on the circulatory system: Secondary | ICD-10-CM | POA: Diagnosis not present

## 2014-06-27 ENCOUNTER — Encounter (HOSPITAL_COMMUNITY)
Admission: RE | Admit: 2014-06-27 | Discharge: 2014-06-27 | Disposition: A | Discharge status: Home / Self Care | Payer: Medicare HMO | Source: Ambulatory Visit | Attending: Cardiology | Admitting: Cardiology

## 2014-06-27 DIAGNOSIS — Z48812 Encounter for surgical aftercare following surgery on the circulatory system: Secondary | ICD-10-CM | POA: Diagnosis not present

## 2014-06-27 NOTE — Progress Notes (Signed)
Lannah Koike Pho 64 y.o. female Nutrition Note Spoke with pt. Nutrition Plan and Nutrition Survey goals reviewed with pt. Pt is following Step 1 of the Therapeutic Lifestyle Changes diet. Pt wants to lose wt. Pt has been loosing wt over the past year by "cutting out soda" and other junk foods. Pt reports she weighed 235 lb a year ago. Pt wt today 96.8 kg, which is down 22 lb from pt's previous UBW. Wt loss tips reviewed.  Pt is diabetic. Per discussion, MD discontinued pt's insulin due to frequent episodes of hypoglycemia. Pt now takes Metformin only if her FBG is >150 mg/dL. Last A1c indicates blood glucose well-controlled. This Probation officer went over Diabetes Education test results. Pt expressed understanding of the information reviewed. Pt aware of nutrition education classes offered and is unable to attend nutrition classes. Lab Results  Component Value Date   HGBA1C 6.9* 04/05/2014   Nutrition Diagnosis ? Food-and nutrition-related knowledge deficit related to lack of exposure to information as related to diagnosis of: ? CVD ? DM ? Obesity related to excessive energy intake as evidenced by a BMI of 36.1  Nutrition RX/ Estimated Daily Nutrition Needs for: wt loss  1250-1750 Kcal, 30-45 gm fat, 8-13 gm sat fat, 1.2-1.7 gm trans-fat, <1500 mg sodium, 150-175 gm CHO   Nutrition Intervention ? Pt's individual nutrition plan reviewed with pt. ? Benefits of adopting Therapeutic Lifestyle Changes discussed when Medficts reviewed. ? Pt to attend the Portion Distortion class ? Pt to attend the Diabetes Q & A class - met: 05/25/14  ? Pt given handouts for: ? Nutrition I class ? Nutrition II class ? Diabetes Blitz Class ? Continue client-centered nutrition education by RD, as part of interdisciplinary care. Goal(s) ? Pt to identify and limit food sources of saturated fat, trans fat, and cholesterol ? Pt to identify food quantities necessary to achieve: ? wt loss to a goal wt of 186-204 lb (84.4-92.6 kg) at  graduation from cardiac rehab.  Monitor and Evaluate progress toward nutrition goal with team. Nutrition Risk: Change to Moderate Derek Mound, M.Ed, RD, LDN, CDE 06/27/2014 2:13 PM

## 2014-06-29 ENCOUNTER — Encounter (HOSPITAL_COMMUNITY)
Admission: RE | Admit: 2014-06-29 | Discharge: 2014-06-29 | Disposition: A | Discharge status: Home / Self Care | Payer: Medicare HMO | Source: Ambulatory Visit | Attending: Cardiology | Admitting: Cardiology

## 2014-06-29 DIAGNOSIS — Z48812 Encounter for surgical aftercare following surgery on the circulatory system: Secondary | ICD-10-CM | POA: Diagnosis not present

## 2014-07-04 ENCOUNTER — Encounter (HOSPITAL_COMMUNITY): Payer: Medicare HMO

## 2014-07-04 ENCOUNTER — Telehealth (HOSPITAL_COMMUNITY): Payer: Self-pay | Admitting: Internal Medicine

## 2014-07-06 ENCOUNTER — Encounter (HOSPITAL_COMMUNITY): Payer: Medicare HMO

## 2014-07-11 ENCOUNTER — Encounter (HOSPITAL_COMMUNITY)
Admission: RE | Admit: 2014-07-11 | Discharge: 2014-07-11 | Disposition: A | Discharge status: Home / Self Care | Payer: Medicare HMO | Source: Ambulatory Visit | Attending: Cardiology | Admitting: Cardiology

## 2014-07-11 DIAGNOSIS — Z48812 Encounter for surgical aftercare following surgery on the circulatory system: Secondary | ICD-10-CM | POA: Diagnosis not present

## 2014-07-11 LAB — GLUCOSE, CAPILLARY: Glucose-Capillary: 220 mg/dL — ABNORMAL HIGH (ref 65–99)

## 2014-07-13 ENCOUNTER — Encounter (HOSPITAL_COMMUNITY)
Admission: RE | Admit: 2014-07-13 | Discharge: 2014-07-13 | Disposition: A | Discharge status: Home / Self Care | Payer: Medicare HMO | Source: Ambulatory Visit | Attending: Cardiology | Admitting: Cardiology

## 2014-07-13 DIAGNOSIS — Z48812 Encounter for surgical aftercare following surgery on the circulatory system: Secondary | ICD-10-CM | POA: Diagnosis not present

## 2014-07-14 LAB — GLUCOSE, CAPILLARY: GLUCOSE-CAPILLARY: 134 mg/dL — AB (ref 65–99)

## 2014-07-18 ENCOUNTER — Encounter (HOSPITAL_COMMUNITY)
Admission: RE | Admit: 2014-07-18 | Discharge: 2014-07-18 | Disposition: A | Discharge status: Home / Self Care | Payer: Medicare HMO | Source: Ambulatory Visit | Attending: Cardiology | Admitting: Cardiology

## 2014-07-18 DIAGNOSIS — Z48812 Encounter for surgical aftercare following surgery on the circulatory system: Secondary | ICD-10-CM | POA: Diagnosis present

## 2014-07-18 DIAGNOSIS — Z955 Presence of coronary angioplasty implant and graft: Secondary | ICD-10-CM | POA: Diagnosis not present

## 2014-07-18 LAB — GLUCOSE, CAPILLARY: GLUCOSE-CAPILLARY: 187 mg/dL — AB (ref 65–99)

## 2014-07-20 ENCOUNTER — Other Ambulatory Visit: Payer: Self-pay

## 2014-07-20 ENCOUNTER — Emergency Department (HOSPITAL_COMMUNITY): Payer: Medicare HMO

## 2014-07-20 ENCOUNTER — Encounter (HOSPITAL_COMMUNITY)
Admission: RE | Admit: 2014-07-20 | Discharge: 2014-07-20 | Disposition: A | Discharge status: Home / Self Care | Payer: Medicare HMO | Source: Ambulatory Visit | Attending: Cardiology | Admitting: Cardiology

## 2014-07-20 ENCOUNTER — Encounter (HOSPITAL_COMMUNITY): Payer: Self-pay | Admitting: Cardiology

## 2014-07-20 ENCOUNTER — Emergency Department (HOSPITAL_COMMUNITY)
Admission: EM | Admit: 2014-07-20 | Discharge: 2014-07-20 | Disposition: A | Discharge status: Home / Self Care | Payer: Medicare HMO | Attending: Emergency Medicine | Admitting: Emergency Medicine

## 2014-07-20 DIAGNOSIS — J45909 Unspecified asthma, uncomplicated: Secondary | ICD-10-CM | POA: Diagnosis not present

## 2014-07-20 DIAGNOSIS — Z79899 Other long term (current) drug therapy: Secondary | ICD-10-CM | POA: Insufficient documentation

## 2014-07-20 DIAGNOSIS — Z48812 Encounter for surgical aftercare following surgery on the circulatory system: Secondary | ICD-10-CM | POA: Diagnosis not present

## 2014-07-20 DIAGNOSIS — E785 Hyperlipidemia, unspecified: Secondary | ICD-10-CM | POA: Insufficient documentation

## 2014-07-20 DIAGNOSIS — Z794 Long term (current) use of insulin: Secondary | ICD-10-CM | POA: Insufficient documentation

## 2014-07-20 DIAGNOSIS — Z9889 Other specified postprocedural states: Secondary | ICD-10-CM | POA: Insufficient documentation

## 2014-07-20 DIAGNOSIS — I10 Essential (primary) hypertension: Secondary | ICD-10-CM | POA: Diagnosis not present

## 2014-07-20 DIAGNOSIS — Z7982 Long term (current) use of aspirin: Secondary | ICD-10-CM | POA: Diagnosis not present

## 2014-07-20 DIAGNOSIS — E119 Type 2 diabetes mellitus without complications: Secondary | ICD-10-CM | POA: Insufficient documentation

## 2014-07-20 DIAGNOSIS — E669 Obesity, unspecified: Secondary | ICD-10-CM | POA: Diagnosis not present

## 2014-07-20 DIAGNOSIS — R011 Cardiac murmur, unspecified: Secondary | ICD-10-CM | POA: Diagnosis not present

## 2014-07-20 DIAGNOSIS — Z955 Presence of coronary angioplasty implant and graft: Secondary | ICD-10-CM | POA: Diagnosis not present

## 2014-07-20 DIAGNOSIS — Z8719 Personal history of other diseases of the digestive system: Secondary | ICD-10-CM | POA: Diagnosis not present

## 2014-07-20 DIAGNOSIS — R0789 Other chest pain: Secondary | ICD-10-CM | POA: Insufficient documentation

## 2014-07-20 DIAGNOSIS — Z8659 Personal history of other mental and behavioral disorders: Secondary | ICD-10-CM | POA: Insufficient documentation

## 2014-07-20 DIAGNOSIS — R079 Chest pain, unspecified: Secondary | ICD-10-CM | POA: Diagnosis present

## 2014-07-20 LAB — BASIC METABOLIC PANEL
ANION GAP: 8 (ref 5–15)
BUN: 10 mg/dL (ref 6–20)
CHLORIDE: 104 mmol/L (ref 101–111)
CO2: 26 mmol/L (ref 22–32)
Calcium: 9.2 mg/dL (ref 8.9–10.3)
Creatinine, Ser: 0.66 mg/dL (ref 0.44–1.00)
GFR calc non Af Amer: >60 mL/min (ref >60)
GLUCOSE: 136 mg/dL — AB (ref 65–99)
Potassium: 3.2 mmol/L — ABNORMAL LOW (ref 3.5–5.1)
SODIUM: 138 mmol/L (ref 135–145)

## 2014-07-20 LAB — CBC
HCT: 38 % (ref 36.0–46.0)
HEMOGLOBIN: 12.8 g/dL (ref 12.0–15.0)
MCH: 27.6 pg (ref 26.0–34.0)
MCHC: 33.7 g/dL (ref 30.0–36.0)
MCV: 82.1 fL (ref 78.0–100.0)
Platelets: 306 10*3/uL (ref 150–400)
RBC: 4.63 MIL/uL (ref 3.87–5.11)
RDW: 13.8 % (ref 11.5–15.5)
WBC: 6.6 10*3/uL (ref 4.0–10.5)

## 2014-07-20 LAB — I-STAT TROPONIN, ED
Troponin i, poc: 0.01 ng/mL (ref 0.00–0.08)
Troponin i, poc: 0 ng/mL (ref 0.00–0.08)

## 2014-07-20 LAB — BRAIN NATRIURETIC PEPTIDE: B NATRIURETIC PEPTIDE 5: 30.7 pg/mL (ref 0.0–100.0)

## 2014-07-20 LAB — GLUCOSE, CAPILLARY: Glucose-Capillary: 158 mg/dL — ABNORMAL HIGH (ref 65–99)

## 2014-07-20 MED ORDER — POTASSIUM CHLORIDE CRYS ER 20 MEQ PO TBCR
40.0000 meq | EXTENDED_RELEASE_TABLET | Freq: Once | ORAL | Status: AC
  Administered 2014-07-20: 40 meq via ORAL
  Filled 2014-07-20: qty 2

## 2014-07-20 MED ORDER — IBUPROFEN 800 MG PO TABS
800.0000 mg | ORAL_TABLET | Freq: Once | ORAL | Status: AC
  Administered 2014-07-20: 800 mg via ORAL
  Filled 2014-07-20: qty 1

## 2014-07-20 NOTE — ED Provider Notes (Signed)
CSN: [id number]     Arrival date & time 07/20/14  1428 History   First MD Initiated Contact with Patient 07/20/14 1550     Chief Complaint  Patient presents with  . Chest Pain     (Consider location/radiation/quality/duration/timing/severity/associated sxs/prior Treatment) The history is provided by the patient.  Kristi Bennett is a 64 y.o. female hx of HTN, GERD, diabetes, CAD s/p stent, here presenting with chest pain. Patient is in cardiac rehabilitation today and was on the treadmill and was working very hard and then had some chest pain. It lasted about 20 minutes and she took one nitroglycerin and the pain was gone. Still has some soreness in the center of her chest. 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 Terrence Dupont several days ago and was noted to have some chest tenderness as well. 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    Past Medical History  Diagnosis Date  . Asthma   . Hypertension   . Allergic rhinitis   . Sinusitis   . Hyperlipidemia   . Obesity   . GERD (gastroesophageal reflux disease)   . Depression   . Memory impairment   . Heart murmur   . Diabetes mellitus without complication    Past Surgical History  Procedure Laterality Date  . Tubal ligation    . Breast lumpectomy    . Hernia repair    . Leg surgery    . Abdominal hysterectomy    . Left heart catheterization with coronary angiogram N/A 04/05/2014    Procedure: LEFT HEART CATHETERIZATION WITH CORONARY ANGIOGRAM;  Surgeon: Clent Demark, MD;  Location: [facility] CATH LAB;  Service: Cardiovascular;  Laterality: N/A;   Family History  Problem Relation Age of Onset  . Hypertension Other   . Coronary artery disease Other    History  Substance Use Topics  . Smoking status: Never Smoker   . Smokeless tobacco: Never Used  . Alcohol Use: Yes     Comment: glass of wine once or twice a month   OB History    No data available     Review of Systems   Cardiovascular: Positive for chest pain.  All other systems reviewed and are negative.     Allergies  Avapro; Avelox; Excedrin extra strength; and Lipitor  Home Medications   Prior to Admission medications   Medication Sig Start Date End Date Taking? Authorizing Provider  acetaminophen (TYLENOL) 500 MG tablet Take 500 mg by mouth every 6 (six) hours as needed for mild pain.    Yes Historical Provider, MD  albuterol (PROVENTIL HFA;VENTOLIN HFA) 108 (90 BASE) MCG/ACT inhaler Inhale 2 puffs into the lungs every 6 (six) hours as needed for wheezing or shortness of breath.    Yes Historical Provider, MD  amLODipine (NORVASC) 5 MG tablet Take 1 tablet (5 mg total) by mouth daily. 04/06/14  Yes Charolette Forward, MD  aspirin EC 81 MG tablet Take 81 mg by mouth daily at 2 PM daily at 2 PM.   Yes Historical Provider, MD  insulin lispro (HUMALOG) 100 UNIT/ML injection Inject 6 Units into the skin daily as needed for high blood sugar. Per sliding scale. Take as needed per patient   Yes Historical Provider, MD  losartan (COZAAR) 100 MG tablet Take 100 mg by mouth daily at 2 PM daily at 2 PM.    Yes Historical Provider, MD  metFORMIN (GLUCOPHAGE) 500 MG tablet Take 1 tablet (  500 mg total) by mouth 2 (two) times daily with a meal. Patient taking differently: Take 500 mg by mouth daily as needed.  04/07/14  Yes Charolette Forward, MD  montelukast (SINGULAIR) 10 MG tablet Take 10 mg by mouth at bedtime.   Yes Historical Provider, MD  nitroGLYCERIN (NITROSTAT) 0.4 MG SL tablet Place 1 tablet (0.4 mg total) under the tongue every 5 (five) minutes as needed for chest pain. 04/06/14  Yes Charolette Forward, MD  Omega-3 Fatty Acids (FISH OIL) 1000 MG CAPS Take 1,000 mg by mouth 2 (two) times daily.   Yes Historical Provider, MD  prasugrel (EFFIENT) 10 MG TABS tablet Take 1 tablet (10 mg total) by mouth daily. 04/06/14  Yes Charolette Forward, MD  rosuvastatin (CRESTOR) 5 MG tablet Take 1 tablet (5 mg total) by mouth daily at 6 PM.  04/06/14  Yes Charolette Forward, MD   BP 162/66 mmHg  Pulse 55  Temp(Src) 97.9 F (36.6 C) (Oral)  Resp 17  Ht 5\' 4"  (1.626 m)  Wt 210 lb 1 oz (95.284 kg)  BMI 36.04 kg/m2  SpO2 100% Physical Exam  Constitutional: She is oriented to person, place, and time. She appears well-developed and well-nourished.  HENT:  Head: Normocephalic.  Mouth/Throat: Oropharynx is clear and moist.  Eyes: Conjunctivae are normal. Pupils are equal, round, and reactive to light.  Neck: Normal range of motion. Neck supple.  Cardiovascular: Normal rate, regular rhythm and normal heart sounds.   Pulmonary/Chest: Effort normal and breath sounds normal. No respiratory distress. She has no wheezes. She has no rales.  + reproducible chest tenderness   Abdominal: Soft. Bowel sounds are normal. She exhibits no distension. There is no tenderness. There is no rebound and no guarding.  Musculoskeletal: Normal range of motion. She exhibits no edema or tenderness.  Neurological: She is alert and oriented to person, place, and time. No cranial nerve deficit. Coordination normal.  Skin: Skin is warm and dry.  Psychiatric: She has a normal mood and affect. Her behavior is normal. Judgment and thought content normal.  Nursing note and vitals reviewed.   ED Course  Procedures (including critical care time) Labs Review Labs Reviewed  BASIC METABOLIC PANEL - Abnormal; Notable for the following:    Potassium 3.2 (*)    Glucose, Bld 136 (*)    All other components within normal limits  CBC  BRAIN NATRIURETIC PEPTIDE  I-STAT TROPOININ, ED  I-STAT TROPOININ, ED    Imaging Review Dg Chest 2 View  07/20/2014   CLINICAL DATA:  Initial encounter for midsternal chest pain and tightness radiating into the back with shortness of breath beginning today.  EXAM: CHEST  2 VIEW  COMPARISON:  07/24/2013.  FINDINGS: The lungs are clear without focal infiltrate, edema, pneumothorax or pleural effusion. Oval density over the left upper lobe  secondary to cardiac lead, as confirmed on lateral film. The cardiopericardial silhouette is within normal limits for size. Imaged bony structures of the thorax are intact.  IMPRESSION: No active cardiopulmonary disease.   Electronically Signed   By: Misty Stanley M.D.   On: 07/20/2014 15:25     EKG Interpretation   Date/Time:  Friday July 20 2014 20:22:09 EDT Ventricular Rate:  60 PR Interval:  165 QRS Duration: 90 QT Interval:  437 QTC Calculation: 437 R Axis:   92 Text Interpretation:  Sinus rhythm Right axis deviation No significant  change since last tracing Confirmed by YAO  MD, DAVID ([id number]) on 07/20/2014  8:28:00 PM  MDM   Final diagnoses:  None    Brin Ruggerio Weesner is a 64 y.o. female hx of cardiac stent with chest pain. Chest pain reproducible, it was brought on by exertion. Offered admission but patient wants to go home. Discussed with Dr. Terrence Dupont, cardiologist, who will see her in the office next week. Will get delta trop and if neg can dc home.    8:28 PM Delta trop neg. Chest pain free. Will dc home.     Wandra Arthurs, MD 07/20/14 2028

## 2014-07-20 NOTE — Discharge Instructions (Signed)
Take your medications as prescribed.   Call Dr. Terrence Dupont on Monday for follow up.   Take motrin as needed for pain.   No strenuous exercise or heavy lifting.  Return to ER if you have severe chest pain, shortness of breath

## 2014-07-20 NOTE — ED Notes (Signed)
Security guard called to drive pt to her car.

## 2014-07-20 NOTE — ED Notes (Signed)
Pt A&Ox4, ambulatory at d/c with steady gait, NAD 

## 2014-07-20 NOTE — ED Notes (Signed)
Pt brought to triage from Cardiac Rehab. Reports she had stents placed in feb. Reports she was at rehab today and started having SOB and chest pain. Pt was given 1 nitro PTA by staff. Rates pain at a 3.

## 2014-07-20 NOTE — Progress Notes (Signed)
Pt c/o chest pain on exertion at cardiac rehab.  Pt rates pain 5/10 associated with dyspnea. BP:  127/57,  Pt reports pain increased with rest to 6/10 with back pain.  Oxygen 4L [place] applied.  Pt placed on zoll monitor.  Repeat BP:  122/98.  12 lead EKG nonspecific ST changes.  Dr. Terrence Dupont made aware. Pt taken to ED via stretcher.  Upon arrival to ED pt pain persists with increased dyspnea.    Pt given NTG SL x1.  Report given to triage RN. Attempt to contact  Pt son Octavia Bruckner.  Lm on vm of pt transfer to ED.

## 2014-07-23 ENCOUNTER — Other Ambulatory Visit: Payer: Self-pay | Admitting: Cardiology

## 2014-07-23 DIAGNOSIS — R079 Chest pain, unspecified: Secondary | ICD-10-CM

## 2014-07-25 ENCOUNTER — Encounter (HOSPITAL_COMMUNITY): Payer: Medicare HMO

## 2014-07-27 ENCOUNTER — Encounter (HOSPITAL_COMMUNITY)
Admission: RE | Admit: 2014-07-27 | Discharge: 2014-07-27 | Disposition: A | Discharge status: Home / Self Care | Payer: Medicare HMO | Source: Ambulatory Visit | Attending: Cardiology | Admitting: Cardiology

## 2014-07-27 ENCOUNTER — Encounter (HOSPITAL_COMMUNITY): Payer: Medicare HMO

## 2014-07-27 DIAGNOSIS — R079 Chest pain, unspecified: Secondary | ICD-10-CM | POA: Insufficient documentation

## 2014-07-27 LAB — NM MYOCAR MULTI W/SPECT W/WALL MOTION / EF
LV sys vol: 25 mL
LVDIAVOL: 72 mL
Nuc Stress EF: 66 %
RATE: 0.34
SDS: 5
SRS: 5
SSS: 10
TID: 0.94

## 2014-07-27 MED ORDER — TECHNETIUM TC 99M SESTAMIBI - CARDIOLITE
30.0000 | Freq: Once | INTRAVENOUS | Status: AC | PRN
  Administered 2014-07-27: 12:00:00 30 via INTRAVENOUS

## 2014-07-27 MED ORDER — REGADENOSON 0.4 MG/5ML IV SOLN
0.4000 mg | Freq: Once | INTRAVENOUS | Status: AC
  Administered 2014-07-27: 0.4 mg via INTRAVENOUS

## 2014-07-27 MED ORDER — TECHNETIUM TC 99M SESTAMIBI GENERIC - CARDIOLITE
10.0000 | Freq: Once | INTRAVENOUS | Status: AC | PRN
  Administered 2014-07-27: 10 via INTRAVENOUS

## 2014-07-27 MED ORDER — REGADENOSON 0.4 MG/5ML IV SOLN
INTRAVENOUS | Status: AC
  Filled 2014-07-27: qty 5

## 2014-08-01 ENCOUNTER — Telehealth (HOSPITAL_COMMUNITY): Payer: Self-pay | Admitting: Cardiac Rehabilitation

## 2014-08-01 ENCOUNTER — Encounter (HOSPITAL_COMMUNITY): Payer: Medicare HMO

## 2014-08-01 NOTE — Telephone Encounter (Signed)
pc received from pt to assess readiness to return to rehab.  Pt reports she was given written and verbal release to return to exercise. Pt does not plan to attend today.  She will plan to return on Friday.

## 2014-08-03 ENCOUNTER — Encounter (HOSPITAL_COMMUNITY)
Admission: RE | Admit: 2014-08-03 | Discharge: 2014-08-03 | Disposition: A | Discharge status: Home / Self Care | Payer: Medicare HMO | Source: Ambulatory Visit | Attending: Cardiology | Admitting: Cardiology

## 2014-08-03 DIAGNOSIS — Z48812 Encounter for surgical aftercare following surgery on the circulatory system: Secondary | ICD-10-CM | POA: Diagnosis not present

## 2014-08-03 LAB — GLUCOSE, CAPILLARY: Glucose-Capillary: 197 mg/dL — ABNORMAL HIGH (ref 65–99)

## 2014-08-08 ENCOUNTER — Encounter (HOSPITAL_COMMUNITY): Payer: Medicare HMO

## 2014-08-10 ENCOUNTER — Encounter (HOSPITAL_COMMUNITY)
Admission: RE | Admit: 2014-08-10 | Discharge: 2014-08-10 | Disposition: A | Discharge status: Home / Self Care | Payer: Medicare HMO | Source: Ambulatory Visit | Attending: Cardiology | Admitting: Cardiology

## 2014-08-10 DIAGNOSIS — Z48812 Encounter for surgical aftercare following surgery on the circulatory system: Secondary | ICD-10-CM | POA: Diagnosis not present

## 2014-08-10 NOTE — Progress Notes (Signed)
With clearance from Dr. Terrence Dupont,  pt returned to cardiac rehab today.  Pt tolerated light exercise without difficulty.

## 2014-08-13 ENCOUNTER — Encounter (HOSPITAL_COMMUNITY)
Admission: RE | Admit: 2014-08-13 | Discharge: 2014-08-13 | Disposition: A | Discharge status: Home / Self Care | Payer: Medicare HMO | Source: Ambulatory Visit | Attending: Cardiology | Admitting: Cardiology

## 2014-08-13 DIAGNOSIS — Z48812 Encounter for surgical aftercare following surgery on the circulatory system: Secondary | ICD-10-CM | POA: Diagnosis not present

## 2014-08-15 ENCOUNTER — Encounter (HOSPITAL_COMMUNITY)
Admission: RE | Admit: 2014-08-15 | Discharge: 2014-08-15 | Disposition: A | Discharge status: Home / Self Care | Payer: Medicare HMO | Source: Ambulatory Visit | Attending: Cardiology | Admitting: Cardiology

## 2014-08-15 DIAGNOSIS — Z48812 Encounter for surgical aftercare following surgery on the circulatory system: Secondary | ICD-10-CM | POA: Diagnosis not present

## 2014-08-17 ENCOUNTER — Encounter (HOSPITAL_COMMUNITY)
Admission: RE | Admit: 2014-08-17 | Discharge: 2014-08-17 | Disposition: A | Discharge status: Home / Self Care | Payer: Medicare HMO | Source: Ambulatory Visit | Attending: Cardiology | Admitting: Cardiology

## 2014-08-17 DIAGNOSIS — Z48812 Encounter for surgical aftercare following surgery on the circulatory system: Secondary | ICD-10-CM | POA: Insufficient documentation

## 2014-08-17 DIAGNOSIS — Z955 Presence of coronary angioplasty implant and graft: Secondary | ICD-10-CM | POA: Diagnosis not present

## 2014-08-22 ENCOUNTER — Encounter (HOSPITAL_COMMUNITY)
Admission: RE | Admit: 2014-08-22 | Discharge: 2014-08-22 | Disposition: A | Discharge status: Home / Self Care | Payer: Medicare HMO | Source: Ambulatory Visit | Attending: Cardiology | Admitting: Cardiology

## 2014-08-22 DIAGNOSIS — Z48812 Encounter for surgical aftercare following surgery on the circulatory system: Secondary | ICD-10-CM | POA: Diagnosis not present

## 2014-08-24 ENCOUNTER — Encounter (HOSPITAL_COMMUNITY)
Admission: RE | Admit: 2014-08-24 | Discharge: 2014-08-24 | Disposition: A | Discharge status: Home / Self Care | Payer: Medicare HMO | Source: Ambulatory Visit | Attending: Cardiology | Admitting: Cardiology

## 2014-08-24 ENCOUNTER — Encounter (HOSPITAL_COMMUNITY): Payer: Self-pay

## 2014-08-24 DIAGNOSIS — Z48812 Encounter for surgical aftercare following surgery on the circulatory system: Secondary | ICD-10-CM | POA: Diagnosis not present

## 2014-08-24 LAB — GLUCOSE, CAPILLARY
Glucose-Capillary: 289 mg/dL — ABNORMAL HIGH (ref 65–99)
Glucose-Capillary: 190 mg/dL — ABNORMAL HIGH (ref 65–99)

## 2014-08-24 NOTE — Progress Notes (Signed)
Pt graduated from cardiac rehab program today with completion of 25 exercise sessions in Phase II. Pt exited program early due to work schedule.  Pt maintained average attendance, frequent absences due to work and back pain.  Pt progressed  during her participation in rehab as evidenced by increased MET level.   Medication list reconciled. Repeat  PHQ score-0  .  Pt has made significant lifestyle changes and should be commended for her success. Pt does not feel she met her goals due to limitations from back pain.    Pt plans to continue exercising on her own at [facility] and walking at home.

## 2014-08-29 ENCOUNTER — Encounter (HOSPITAL_COMMUNITY): Payer: Medicare HMO

## 2014-08-31 ENCOUNTER — Encounter (HOSPITAL_COMMUNITY): Payer: Medicare HMO

## 2015-02-15 ENCOUNTER — Other Ambulatory Visit: Payer: Self-pay | Admitting: Cardiology

## 2015-02-15 DIAGNOSIS — R079 Chest pain, unspecified: Secondary | ICD-10-CM

## 2015-02-22 ENCOUNTER — Encounter (HOSPITAL_COMMUNITY)
Admission: RE | Admit: 2015-02-22 | Discharge: 2015-02-22 | Disposition: A | Discharge status: Home / Self Care | Payer: Medicare HMO | Source: Ambulatory Visit | Attending: Cardiology | Admitting: Cardiology

## 2015-02-22 DIAGNOSIS — R079 Chest pain, unspecified: Secondary | ICD-10-CM | POA: Diagnosis not present

## 2015-02-22 DIAGNOSIS — E119 Type 2 diabetes mellitus without complications: Secondary | ICD-10-CM | POA: Diagnosis not present

## 2015-02-22 DIAGNOSIS — Z0189 Encounter for other specified special examinations: Secondary | ICD-10-CM | POA: Insufficient documentation

## 2015-02-22 DIAGNOSIS — I2510 Atherosclerotic heart disease of native coronary artery without angina pectoris: Secondary | ICD-10-CM | POA: Diagnosis not present

## 2015-02-22 DIAGNOSIS — I10 Essential (primary) hypertension: Secondary | ICD-10-CM | POA: Diagnosis not present

## 2015-02-22 DIAGNOSIS — K219 Gastro-esophageal reflux disease without esophagitis: Secondary | ICD-10-CM | POA: Insufficient documentation

## 2015-02-22 LAB — BASIC METABOLIC PANEL
ANION GAP: 10 (ref 5–15)
BUN: 10 mg/dL (ref 6–20)
CHLORIDE: 106 mmol/L (ref 101–111)
CO2: 24 mmol/L (ref 22–32)
Calcium: 8.9 mg/dL (ref 8.9–10.3)
Creatinine, Ser: 0.54 mg/dL (ref 0.44–1.00)
GFR calc non Af Amer: >60 mL/min (ref >60)
Glucose, Bld: 181 mg/dL — ABNORMAL HIGH (ref 65–99)
POTASSIUM: 3.4 mmol/L — AB (ref 3.5–5.1)
Sodium: 140 mmol/L (ref 135–145)

## 2015-02-22 LAB — HEPATIC FUNCTION PANEL
ALBUMIN: 3.3 g/dL — AB (ref 3.5–5.0)
ALT: 27 U/L (ref 14–54)
AST: 30 U/L (ref 15–41)
Alkaline Phosphatase: 162 U/L — ABNORMAL HIGH (ref 38–126)
Bilirubin, Direct: <0.1 mg/dL — ABNORMAL LOW (ref 0.1–0.5)
Total Bilirubin: 0.3 mg/dL (ref 0.3–1.2)
Total Protein: 7.1 g/dL (ref 6.5–8.1)

## 2015-02-22 LAB — LIPID PANEL
Cholesterol: 167 mg/dL (ref 0–200)
HDL: 51 mg/dL (ref >40)
LDL CALC: 104 mg/dL — AB (ref 0–99)
TRIGLYCERIDES: 60 mg/dL (ref <150)
Total CHOL/HDL Ratio: 3.3 RATIO
VLDL: 12 mg/dL (ref 0–40)

## 2015-02-22 MED ORDER — TECHNETIUM TC 99M SESTAMIBI - CARDIOLITE
30.0000 | Freq: Once | INTRAVENOUS | Status: AC | PRN
  Administered 2015-02-22: 11:00:00 30 via INTRAVENOUS

## 2015-02-22 MED ORDER — TECHNETIUM TC 99M SESTAMIBI GENERIC - CARDIOLITE
10.0000 | Freq: Once | INTRAVENOUS | Status: AC | PRN
  Administered 2015-02-22: 10 via INTRAVENOUS

## 2015-02-22 MED ORDER — REGADENOSON 0.4 MG/5ML IV SOLN
INTRAVENOUS | Status: AC
  Administered 2015-02-22: 0.4 mg
  Filled 2015-02-22: qty 5

## 2015-02-22 MED ORDER — REGADENOSON 0.4 MG/5ML IV SOLN: 0.4000 mg | Freq: Once | INTRAVENOUS | Status: DC

## 2015-02-23 LAB — NM MYOCAR MULTI W/SPECT W/WALL MOTION / EF
CHL CUP STRESS STAGE 1 DBP: 72 mmHg
CHL CUP STRESS STAGE 1 GRADE: 0 %
CHL CUP STRESS STAGE 1 HR: 60 {beats}/min
CHL CUP STRESS STAGE 2 GRADE: 0 %
CHL CUP STRESS STAGE 3 DBP: 78 mmHg
CHL CUP STRESS STAGE 3 HR: 76 {beats}/min
CSEPPBP: 144 mmHg
CSEPPHR: 69 {beats}/min
Estimated workload: 1 METS
Percent of predicted max HR: 44 %
Stage 1 SBP: 146 mmHg
Stage 1 Speed: 0 mph
Stage 2 HR: 60 {beats}/min
Stage 2 Speed: 0 mph
Stage 3 Grade: 0 %
Stage 3 SBP: 148 mmHg
Stage 3 Speed: 0 mph
Stage 4 DBP: 76 mmHg
Stage 4 Grade: 0 %
Stage 4 HR: 68 {beats}/min
Stage 4 SBP: 149 mmHg
Stage 4 Speed: 0 mph
Stage 5 DBP: 79 mmHg
Stage 5 Grade: 0 %
Stage 5 HR: 69 {beats}/min
Stage 5 SBP: 144 mmHg
Stage 5 Speed: 0 mph

## 2015-12-02 IMAGING — CR DG CHEST 2V
2 series · 2 of 2 positions shown · non-contrast
Comparison: 07/24/2013.

CLINICAL DATA: Initial encounter for midsternal chest pain and
tightness radiating into the back with shortness of breath beginning
today.

EXAM:
CHEST  2 VIEW

[chest pa]
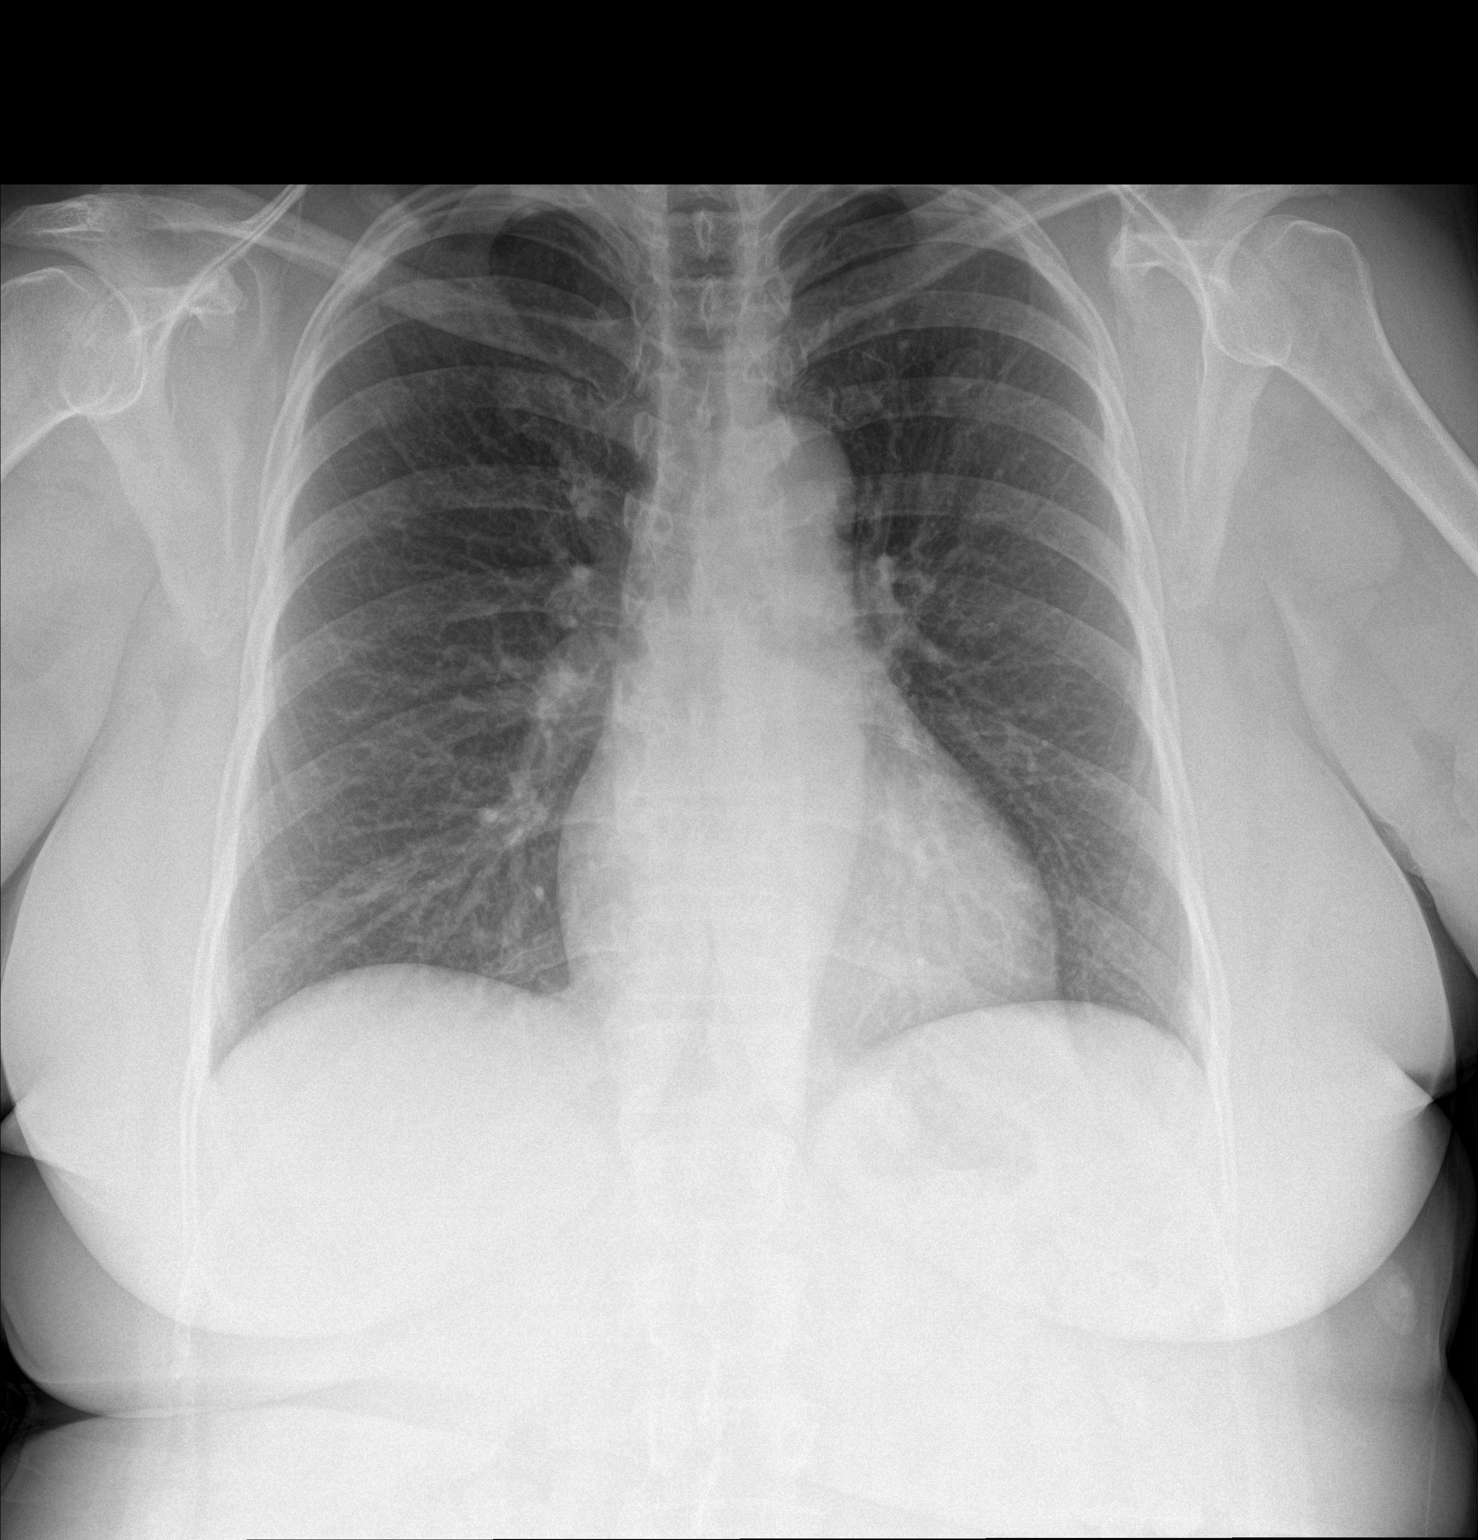

[chest lat]
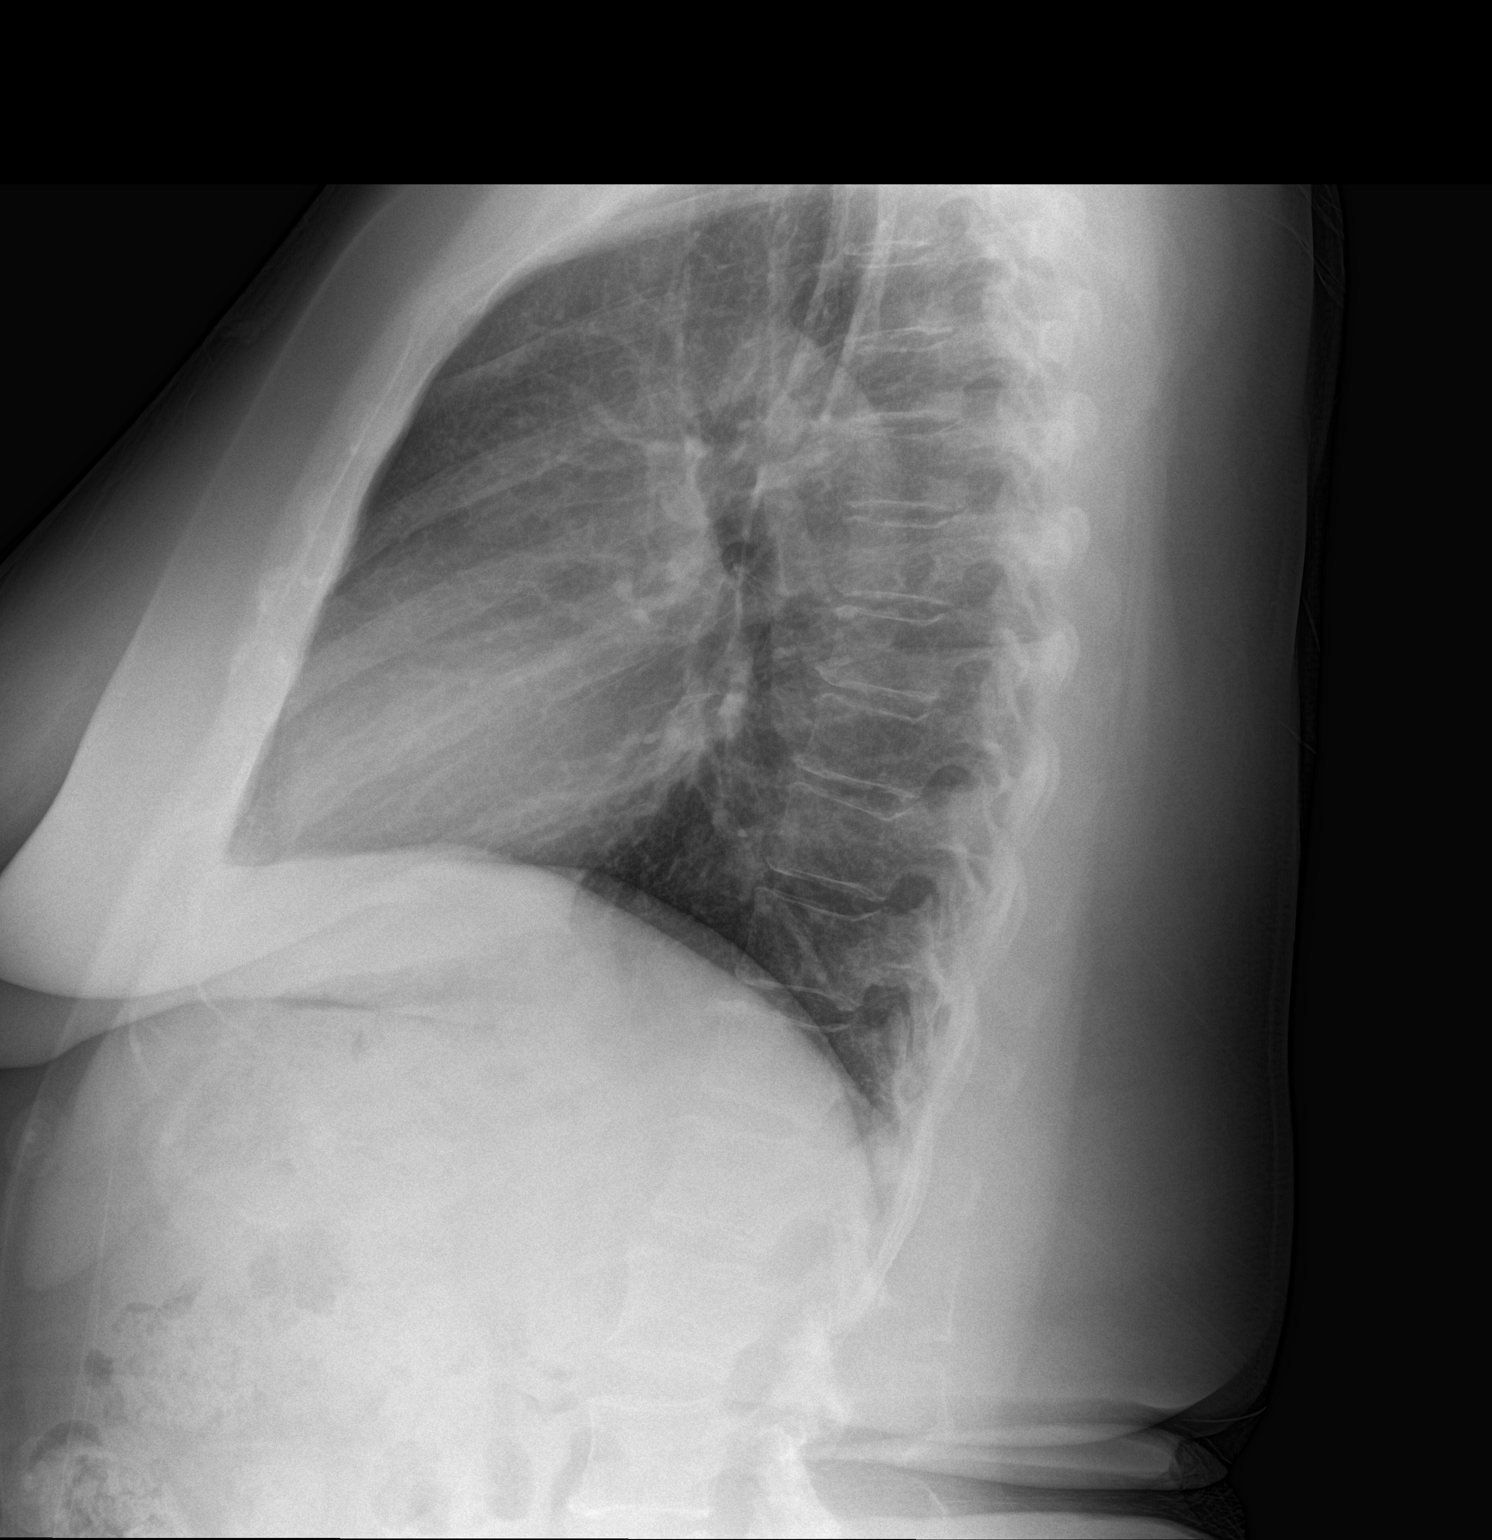

[2 of 2 positions shown; findings below may reference images not displayed]

FINDINGS: The lungs are clear without focal infiltrate, edema, pneumothorax or
pleural effusion. Oval density over the left upper lobe secondary to
cardiac lead, as confirmed on lateral film. The cardiopericardial
silhouette is within normal limits for size. Imaged bony structures
of the thorax are intact.
IMPRESSION: No active cardiopulmonary disease.

## 2016-01-27 ENCOUNTER — Other Ambulatory Visit: Payer: Self-pay | Admitting: Radiology

## 2016-06-17 ENCOUNTER — Other Ambulatory Visit: Payer: Self-pay | Admitting: Cardiology

## 2016-06-17 DIAGNOSIS — R079 Chest pain, unspecified: Secondary | ICD-10-CM

## 2016-06-27 ENCOUNTER — Encounter (HOSPITAL_COMMUNITY): Payer: Self-pay | Admitting: Emergency Medicine

## 2016-06-27 ENCOUNTER — Ambulatory Visit (HOSPITAL_COMMUNITY)
Admission: EM | Admit: 2016-06-27 | Discharge: 2016-06-27 | Disposition: A | Discharge status: Home / Self Care | Payer: Medicare HMO | Attending: Internal Medicine | Admitting: Internal Medicine

## 2016-06-27 ENCOUNTER — Ambulatory Visit (INDEPENDENT_AMBULATORY_CARE_PROVIDER_SITE_OTHER): Payer: Medicare HMO

## 2016-06-27 DIAGNOSIS — S80212A Abrasion, left knee, initial encounter: Secondary | ICD-10-CM

## 2016-06-27 NOTE — ED Triage Notes (Signed)
Here for left knee inj onset 1300 associated w/swelling and hematoma   Reports she tripped and fell onto concrete and all weight was placed on left knee  Pain increases w/activity  Brought back on wheelchair... A&O x4.. NAD

## 2016-06-27 NOTE — ED Provider Notes (Signed)
[facility]    CSN: [id number] Arrival date & time: 06/27/16  1342     History   Chief Complaint Chief Complaint  Patient presents with  . Knee Injury    HPI Kristi Bennett is a 66 y.o. female. She presents today with a scrape on her left knee, stepped over a curb at church today, lost her balance and fell forward onto the left knee. Not much pain or swelling in the knee, but she can feel some crunching when she bends her knee. She exercises frequently, and wonders if that's what the crunching is from. Not aware of any history of arthritis. No other injury reported.    HPI  Past Medical History:  Diagnosis Date  . Allergic rhinitis   . Asthma   . Depression   . Diabetes mellitus without complication ([place])   . GERD (gastroesophageal reflux disease)   . Heart murmur   . Hyperlipidemia   . Hypertension   . Memory impairment   . Obesity   . Sinusitis     Patient Active Problem List   Diagnosis Date Noted  . New-onset angina ([place]) 04/05/2014  . Chest tightness 07/24/2013  . Hypertension   . Hyperlipidemia   . Diabetes mellitus without complication ([place])   . GERD (gastroesophageal reflux disease)   . Obesity     Past Surgical History:  Procedure Laterality Date  . ABDOMINAL HYSTERECTOMY    . BREAST LUMPECTOMY    . HERNIA REPAIR    . LEFT HEART CATHETERIZATION WITH CORONARY ANGIOGRAM N/A 04/05/2014   Procedure: LEFT HEART CATHETERIZATION WITH CORONARY ANGIOGRAM;  Surgeon: Clent Demark, MD;  Location: [facility] C[facility] LAB;  Service: Cardiovascular;  Laterality: N/A;  . LEG SURGERY    . TUBAL LIGATION       Home Medications    Prior to Admission medications   Medication Sig Start Date End Date Taking? Authorizing Provider  acetaminophen (TYLENOL) 500 MG tablet Take 500 mg by mouth every 6 (six) hours as needed for mild pain.    Yes [provider]  albuterol (PROVENTIL HFA;VENTOLIN HFA) 108 (90 BASE) MCG/ACT inhaler Inhale 2 puffs into the lungs  every 6 (six) hours as needed for wheezing or shortness of breath.    Yes [provider]  amLODipine (NORVASC) 5 MG tablet Take 1 tablet (5 mg total) by mouth daily. 04/06/14  Yes Charolette Forward, MD  aspirin EC 81 MG tablet Take 81 mg by mouth daily at 2 PM daily at 2 PM.   Yes [provider]  citalopram (CELEXA) 10 MG tablet Take 10 mg by mouth daily.   Yes [provider]  losartan (COZAAR) 100 MG tablet Take 100 mg by mouth daily at 2 PM daily at 2 PM.    Yes [provider]  metFORMIN (GLUCOPHAGE) 500 MG tablet Take 1 tablet (500 mg total) by mouth 2 (two) times daily with a meal. 04/07/14  Yes Charolette Forward, MD  montelukast (SINGULAIR) 10 MG tablet Take 10 mg by mouth at bedtime.   Yes [provider]  Omega-3 Fatty Acids (FISH OIL) 1000 MG CAPS Take 1,000 mg by mouth 2 (two) times daily.   Yes [provider]  potassium chloride (K-DUR,KLOR-CON) 10 MEQ tablet Take 10 mEq by mouth daily.   Yes [provider]  rosuvastatin (CRESTOR) 5 MG tablet Take 1 tablet (5 mg total) by mouth daily at 6 PM. 04/06/14  Yes Charolette Forward, MD  nitroGLYCERIN (NITROSTAT) 0.4  MG SL tablet Place 1 tablet (0.4 mg total) under the tongue every 5 (five) minutes as needed for chest pain. 04/06/14   Charolette Forward, MD    Family History Family History  Problem Relation Age of Onset  . Hypertension Other   . Coronary artery disease Other     Social History Social History  Substance Use Topics  . Smoking status: Never Smoker  . Smokeless tobacco: Never Used  . Alcohol use Yes     Comment: glass of wine once or twice a month     Allergies   Avapro [irbesartan]; Avelox [moxifloxacin]; Excedrin extra strength [aspirin-acetaminophen-caffeine]; and Lipitor [atorvastatin]   Review of Systems Review of Systems  All other systems reviewed and are negative.    Physical Exam Triage Vital Signs ED Triage Vitals [06/27/16 1409]  Enc Vitals Group       BP (!) 150/70     Pulse Rate 66     Resp 20     Temp 98.5 F (36.9 C)     Temp Source Oral     SpO2 97 %     Weight      Height      Pain Score      Pain Loc    Updated Vital Signs BP (!) 150/70 (BP Location: Right Arm)   Pulse 66   Temp 98.5 F (36.9 C) (Oral)   Resp 20   SpO2 97%   Physical Exam  Constitutional: She is oriented to person, place, and time. No distress.  HENT:  Head: Atraumatic.  Eyes:  Conjugate gaze observed, no eye redness/discharge  Neck: Neck supple.  Cardiovascular: Normal rate.   Pulmonary/Chest: No respiratory distress.  Abdominal: She exhibits no distension.  Musculoskeletal: Normal range of motion.  2 inch very superficial abrasion at the inferolateral aspect of the left knee. Faintly red/bruised area around the abraded zone. Full range of motion including full flexion and full extension at the left knee, there is some crunching palpable during range of motion. No knee effusion.  Neurological: She is alert and oriented to person, place, and time.  Skin: Skin is warm and dry.  Nursing note and vitals reviewed.    UC Treatments / Results   Radiology Dg Knee Complete 4 Views Left  Result Date: 06/27/2016 CLINICAL DATA:  Fall off curb with injury to left knee. Initial encounter. EXAM: LEFT KNEE - COMPLETE 4+ VIEW COMPARISON:  None. FINDINGS: No evidence of fracture, dislocation, or joint effusion. Moderate proliferative disease of the patellofemoral joint and mild proliferative disease of medial and lateral compartments. Soft tissues are unremarkable. IMPRESSION: No acute findings. 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 Electronically Signed   By: Aletta Edouard M.D.   On: 06/27/2016 14:55    Procedures Procedures (including critical care time) None today  Final Clinical Impressions(s) / UC Diagnoses   Final diagnoses:  Abrasion, left knee, initial encounter   Xray at the  urgent care today showed no bony abnormality/fracture.  Use ice for 5-10 minutes several times daily to help with swelling and decrease pain.  Wash scrape on knee gently with soap/water 1-2 times daily, apply antibiotic ointment and bandage.  Recheck if any increasing redness/swelling/pain/drainage or new fever>100.5, or if not starting to improve in a few days.    Sherlene Shams, MD 06/30/16 [phone]

## 2016-06-27 NOTE — Discharge Instructions (Addendum)
Xray at the urgent care today showed no bony abnormality/fracture.  Use ice for 5-10 minutes several times daily to help with swelling and decrease pain.  Wash scrape on knee gently with soap/water 1-2 times daily, apply antibiotic ointment and bandage.  Recheck if any increasing redness/swelling/pain/drainage or new fever>100.5, or if not starting to improve in a few days.

## 2016-07-08 ENCOUNTER — Encounter (HOSPITAL_COMMUNITY)
Admission: RE | Admit: 2016-07-08 | Discharge: 2016-07-08 | Disposition: A | Discharge status: Home / Self Care | Payer: Medicare HMO | Source: Ambulatory Visit | Attending: Cardiology | Admitting: Cardiology

## 2016-07-08 DIAGNOSIS — R079 Chest pain, unspecified: Secondary | ICD-10-CM | POA: Diagnosis present

## 2016-07-08 MED ORDER — REGADENOSON 0.4 MG/5ML IV SOLN
INTRAVENOUS | Status: AC
  Administered 2016-07-08: 0.4 mg
  Filled 2016-07-08: qty 5

## 2016-07-08 MED ORDER — ONDANSETRON HCL 4 MG/2ML IJ SOLN
4.0000 mg | Freq: Once | INTRAMUSCULAR | Status: AC
  Administered 2016-07-08: 4 mg via INTRAVENOUS

## 2016-07-08 MED ORDER — ONDANSETRON HCL 4 MG/2ML IJ SOLN
INTRAMUSCULAR | Status: AC
  Filled 2016-07-08: qty 2

## 2016-07-08 MED ORDER — TECHNETIUM TC 99M TETROFOSMIN IV KIT
30.0000 | PACK | Freq: Once | INTRAVENOUS | Status: AC | PRN
  Administered 2016-07-08: 30 via INTRAVENOUS

## 2016-07-08 MED ORDER — TECHNETIUM TC 99M TETROFOSMIN IV KIT
10.0000 | PACK | Freq: Once | INTRAVENOUS | Status: AC | PRN
  Administered 2016-07-08: 10 via INTRAVENOUS

## 2016-08-09 ENCOUNTER — Ambulatory Visit (HOSPITAL_COMMUNITY)
Admission: EM | Admit: 2016-08-09 | Discharge: 2016-08-09 | Disposition: A | Discharge status: Home / Self Care | Payer: Medicare HMO | Attending: Family Medicine | Admitting: Family Medicine

## 2016-08-09 ENCOUNTER — Encounter (HOSPITAL_COMMUNITY): Payer: Self-pay | Admitting: Emergency Medicine

## 2016-08-09 DIAGNOSIS — J209 Acute bronchitis, unspecified: Secondary | ICD-10-CM

## 2016-08-09 DIAGNOSIS — R05 Cough: Secondary | ICD-10-CM | POA: Diagnosis not present

## 2016-08-09 DIAGNOSIS — R059 Cough, unspecified: Secondary | ICD-10-CM

## 2016-08-09 MED ORDER — BENZONATATE 100 MG PO CAPS: 200.0000 mg | ORAL_CAPSULE | Freq: Three times a day (TID) | ORAL | 0 refills | Status: DC | PRN

## 2016-08-09 MED ORDER — METHYLPREDNISOLONE 4 MG PO TBPK: ORAL_TABLET | ORAL | 0 refills | Status: DC

## 2016-08-09 MED ORDER — AZITHROMYCIN 250 MG PO TABS: 250.0000 mg | ORAL_TABLET | Freq: Every day | ORAL | 0 refills | Status: DC

## 2016-08-09 MED ORDER — ALBUTEROL SULFATE (2.5 MG/3ML) 0.083% IN NEBU
INHALATION_SOLUTION | RESPIRATORY_TRACT | Status: AC
  Filled 2016-08-09: qty 3

## 2016-08-09 MED ORDER — ALBUTEROL SULFATE (2.5 MG/3ML) 0.083% IN NEBU
2.5000 mg | INHALATION_SOLUTION | Freq: Once | RESPIRATORY_TRACT | Status: AC
  Administered 2016-08-09: 2.5 mg via RESPIRATORY_TRACT

## 2016-08-09 NOTE — ED Triage Notes (Signed)
Pt reports a sore throat on Friday.  She states she rested all day yesterday and drank fluids. She woke up this morning with dizziness, wheezing, sinus pressure, loss of appetite, and a fever of 100.

## 2016-08-09 NOTE — ED Provider Notes (Signed)
CSN: [id number]     Arrival date & time 08/09/16  1826 History   None    Chief Complaint  Patient presents with  . Asthma   (Consider location/radiation/quality/duration/timing/severity/associated sxs/prior Treatment) Patient c/o sore throat and cough.  She c/o wheezing and sinus pressure.   The history is provided by the patient.  Asthma  This is a new problem. The problem occurs constantly. Nothing aggravates the symptoms.    Past Medical History:  Diagnosis Date  . Allergic rhinitis   . Asthma   . Depression   . Diabetes mellitus without complication ([place])   . GERD (gastroesophageal reflux disease)   . Heart murmur   . Hyperlipidemia   . Hypertension   . Memory impairment   . Obesity   . Sinusitis    Past Surgical History:  Procedure Laterality Date  . ABDOMINAL HYSTERECTOMY    . BREAST LUMPECTOMY    . HERNIA REPAIR    . LEFT HEART CATHETERIZATION WITH CORONARY ANGIOGRAM N/A 04/05/2014   Procedure: LEFT HEART CATHETERIZATION WITH CORONARY ANGIOGRAM;  Surgeon: Clent Demark, MD;  Location: [facility] CATH LAB;  Service: Cardiovascular;  Laterality: N/A;  . LEG SURGERY    . TUBAL LIGATION     Family History  Problem Relation Age of Onset  . Hypertension Other   . Coronary artery disease Other    Social History  Substance Use Topics  . Smoking status: Never Smoker  . Smokeless tobacco: Never Used  . Alcohol use Yes     Comment: glass of wine once or twice a month   OB History    No data available     Review of Systems  Constitutional: Negative.   HENT: Negative.   Eyes: Negative.   Respiratory: Positive for cough and wheezing.   Cardiovascular: Negative.   Gastrointestinal: Negative.   Endocrine: Negative.   Genitourinary: Negative.   Musculoskeletal: Negative.   Allergic/Immunologic: Negative.   Neurological: Negative.   Hematological: Negative.   Psychiatric/Behavioral: Negative.     Allergies  Avapro [irbesartan]; Avelox [moxifloxacin]; Excedrin  extra strength [aspirin-acetaminophen-caffeine]; and Lipitor [atorvastatin]  Home Medications   Prior to Admission medications   Medication Sig Start Date End Date Taking? Authorizing Provider  albuterol (PROVENTIL HFA;VENTOLIN HFA) 108 (90 BASE) MCG/ACT inhaler Inhale 2 puffs into the lungs every 6 (six) hours as needed for wheezing or shortness of breath.    Yes [provider]  amLODipine (NORVASC) 5 MG tablet Take 1 tablet (5 mg total) by mouth daily. 04/06/14  Yes Charolette Forward, MD  aspirin EC 81 MG tablet Take 81 mg by mouth daily at 2 PM daily at 2 PM.   Yes [provider]  citalopram (CELEXA) 10 MG tablet Take 10 mg by mouth daily.   Yes [provider]  losartan (COZAAR) 100 MG tablet Take 100 mg by mouth daily at 2 PM daily at 2 PM.    Yes [provider]  metFORMIN (GLUCOPHAGE) 500 MG tablet Take 1 tablet (500 mg total) by mouth 2 (two) times daily with a meal. 04/07/14  Yes Charolette Forward, MD  montelukast (SINGULAIR) 10 MG tablet Take 10 mg by mouth at bedtime.   Yes [provider]  Omega-3 Fatty Acids (FISH OIL) 1000 MG CAPS Take 1,000 mg by mouth 2 (two) times daily.   Yes [provider]  rosuvastatin (CRESTOR) 5 MG tablet Take 1 tablet (5 mg total) by mouth daily at 6 PM. 04/06/14  Yes Charolette Forward, MD  acetaminophen (TYLENOL) 500 MG tablet Take 500 mg by mouth every 6 (six) hours as needed for mild pain.     [provider]  azithromycin (ZITHROMAX) 250 MG tablet Take 1 tablet (250 mg total) by mouth daily. Take first 2 tablets together, then 1 every day until finished. 08/09/16   Lysbeth Penner, FNP  benzonatate (TESSALON) 100 MG capsule Take 2 capsules (200 mg total) by mouth 3 (three) times daily as needed for cough. 08/09/16   Lysbeth Penner, FNP  methylPREDNISolone (MEDROL DOSEPAK) 4 MG TBPK tablet Take 6-5-4-3-2-1- po qd 08/09/16   Lysbeth Penner, FNP  nitroGLYCERIN (NITROSTAT) 0.4 MG SL tablet Place 1  tablet (0.4 mg total) under the tongue every 5 (five) minutes as needed for chest pain. 04/06/14   Charolette Forward, MD  potassium chloride (K-DUR,KLOR-CON) 10 MEQ tablet Take 10 mEq by mouth daily.    [provider]   Meds Ordered and Administered this Visit   Medications  albuterol (PROVENTIL) (2.5 MG/3ML) 0.083% nebulizer solution 2.5 mg (2.5 mg Nebulization Given 08/09/16 1904)    BP (!) 136/59 (BP Location: Right Arm)   Pulse 94   Temp 100.1 F (37.8 C) (Oral)   SpO2 98%  No data found.   Physical Exam  Constitutional: She appears well-developed and well-nourished.  HENT:  Head: Normocephalic and atraumatic.  Eyes: Conjunctivae and EOM are normal. Pupils are equal, round, and reactive to light.  Neck: Normal range of motion. Neck supple.  Cardiovascular: Normal rate, regular rhythm and normal heart sounds.   Pulmonary/Chest: Effort normal and breath sounds normal.  Abdominal: Soft. Bowel sounds are normal.  Nursing note and vitals reviewed.   Urgent Care Course     Procedures (including critical care time)  Labs Review Labs Reviewed - No data to display  Imaging Review No results found.   Visual Acuity Review  Right Eye Distance:   Left Eye Distance:   Bilateral Distance:    Right Eye Near:   Left Eye Near:    Bilateral Near:         MDM   1. Acute bronchitis, unspecified organism   2. Cough    Zpak Medrol dose pack as directed Tessalon Perles  Conitnue albuterol MDI  Push po fluids, rest, tylenol and motrin otc prn as directed for fever, arthralgias, and myalgias.  Follow up prn if sx's continue or persist.    Lysbeth Penner, FNP 08/09/16 2022

## 2017-07-01 ENCOUNTER — Encounter (HOSPITAL_COMMUNITY): Payer: Self-pay | Admitting: Emergency Medicine

## 2017-07-01 ENCOUNTER — Emergency Department (HOSPITAL_COMMUNITY)
Admission: EM | Admit: 2017-07-01 | Discharge: 2017-07-01 | Disposition: A | Discharge status: Home / Self Care | Payer: Medicare HMO | Attending: Emergency Medicine | Admitting: Emergency Medicine

## 2017-07-01 ENCOUNTER — Other Ambulatory Visit: Payer: Self-pay

## 2017-07-01 DIAGNOSIS — Z79899 Other long term (current) drug therapy: Secondary | ICD-10-CM | POA: Insufficient documentation

## 2017-07-01 DIAGNOSIS — Z7982 Long term (current) use of aspirin: Secondary | ICD-10-CM | POA: Diagnosis not present

## 2017-07-01 DIAGNOSIS — Z7984 Long term (current) use of oral hypoglycemic drugs: Secondary | ICD-10-CM | POA: Insufficient documentation

## 2017-07-01 DIAGNOSIS — J45909 Unspecified asthma, uncomplicated: Secondary | ICD-10-CM | POA: Diagnosis not present

## 2017-07-01 DIAGNOSIS — E119 Type 2 diabetes mellitus without complications: Secondary | ICD-10-CM | POA: Diagnosis not present

## 2017-07-01 DIAGNOSIS — M62838 Other muscle spasm: Secondary | ICD-10-CM | POA: Diagnosis not present

## 2017-07-01 DIAGNOSIS — M542 Cervicalgia: Secondary | ICD-10-CM | POA: Diagnosis present

## 2017-07-01 DIAGNOSIS — I10 Essential (primary) hypertension: Secondary | ICD-10-CM | POA: Insufficient documentation

## 2017-07-01 MED ORDER — KETOROLAC TROMETHAMINE 15 MG/ML IJ SOLN
15.0000 mg | Freq: Once | INTRAMUSCULAR | Status: AC
  Administered 2017-07-01: 15 mg via INTRAMUSCULAR
  Filled 2017-07-01: qty 1

## 2017-07-01 MED ORDER — DIAZEPAM 2 MG PO TABS: 2.0000 mg | ORAL_TABLET | Freq: Two times a day (BID) | ORAL | 0 refills | Status: DC

## 2017-07-01 NOTE — ED Provider Notes (Signed)
[facility] EMERGENCY DEPARTMENT Provider Note   CSN: [id number] Arrival date & time: 07/01/17  1534     History   Chief Complaint Chief Complaint  Patient presents with  . Neck Pain    HPI Kristi Bennett is a 67 y.o. female.  The history is provided by the patient and medical records. No language interpreter was used.  Neck Pain   Pertinent negatives include no chest pain, no numbness and no weakness.   Kristi Bennett is a 67 y.o. female  with a PMH as listed below who presents to the Emergency Department complaining of progressively worsening neck pain over the last 4 days.  She describes the pain as a "pulling" sensation.  She had been vacuuming a good amount for work, but denies any other known injury or inciting events.  She does this fairly regularly.  Initially was worse on the right.  She now has symptoms bilaterally.  Denies any mid pain.  She was seen by her primary doctor yesterday who thought this was likely muscular and started her on high-dose Advil and tramadol.  When she woke up this morning, her neck felt worse, prompting her to come to the emergency department.  She did take a 600 mg ibuprofen prior to arrival with no improvement.  She denies any numbness or tingling.  No weakness.  No fever or chills.  Pain is worse with certain movements such as moving the head left or right as well as lifting up her right arm.  Past Medical History:  Diagnosis Date  . Allergic rhinitis   . Asthma   . Depression   . Diabetes mellitus without complication (Kristi Bennett)   . GERD (gastroesophageal reflux disease)   . Heart murmur   . Hyperlipidemia   . Hypertension   . Memory impairment   . Obesity   . Sinusitis     Patient Active Problem List   Diagnosis Date Noted  . New-onset angina ([place]) 04/05/2014  . Chest tightness 07/24/2013  . Hypertension   . Hyperlipidemia   . Diabetes mellitus without complication ([place])   . GERD (gastroesophageal reflux disease)    . Obesity     Past Surgical History:  Procedure Laterality Date  . ABDOMINAL HYSTERECTOMY    . BREAST LUMPECTOMY    . HERNIA REPAIR    . LEFT HEART CATHETERIZATION WITH CORONARY ANGIOGRAM N/A 04/05/2014   Procedure: LEFT HEART CATHETERIZATION WITH CORONARY ANGIOGRAM;  Surgeon: Clent Demark, MD;  Location: [facility] CATH LAB;  Service: Cardiovascular;  Laterality: N/A;  . LEG SURGERY    . TUBAL LIGATION       OB History   None      Home Medications    Prior to Admission medications   Medication Sig Start Date End Date Taking? Authorizing Provider  acetaminophen (TYLENOL) 500 MG tablet Take 500 mg by mouth every 6 (six) hours as needed for mild pain.     [provider]  albuterol (PROVENTIL HFA;VENTOLIN HFA) 108 (90 BASE) MCG/ACT inhaler Inhale 2 puffs into the lungs every 6 (six) hours as needed for wheezing or shortness of breath.     [provider]  amLODipine (NORVASC) 5 MG tablet Take 1 tablet (5 mg total) by mouth daily. 04/06/14   Kristi Forward, MD  aspirin EC 81 MG tablet Take 81 mg by mouth daily at 2 PM daily at 2 PM.    [provider]  azithromycin (ZITHROMAX) 250 MG tablet Take 1  tablet (250 mg total) by mouth daily. Take first 2 tablets together, then 1 every day until finished. 08/09/16   Kristi Penner, FNP  benzonatate (TESSALON) 100 MG capsule Take 2 capsules (200 mg total) by mouth 3 (three) times daily as needed for cough. 08/09/16   Kristi Penner, FNP  citalopram (CELEXA) 10 MG tablet Take 10 mg by mouth daily.    [provider]  diazepam (VALIUM) 2 MG tablet Take 1 tablet (2 mg total) by mouth 2 (two) times daily. 07/01/17   Kristi Bennett, Kristi Almond, PA-C  losartan (COZAAR) 100 MG tablet Take 100 mg by mouth daily at 2 PM daily at 2 PM.     [provider]  metFORMIN (GLUCOPHAGE) 500 MG tablet Take 1 tablet (500 mg total) by mouth 2 (two) times daily with a meal. 04/07/14   Kristi Forward, MD  methylPREDNISolone (MEDROL  DOSEPAK) 4 MG TBPK tablet Take 6-5-4-3-2-1- po qd 08/09/16   Kristi Penner, FNP  montelukast (SINGULAIR) 10 MG tablet Take 10 mg by mouth at bedtime.    [provider]  nitroGLYCERIN (NITROSTAT) 0.4 MG SL tablet Place 1 tablet (0.4 mg total) under the tongue every 5 (five) minutes as needed for chest pain. 04/06/14   Kristi Forward, MD  Omega-3 Fatty Acids (FISH OIL) 1000 MG CAPS Take 1,000 mg by mouth 2 (two) times daily.    [provider]  potassium chloride (K-DUR,KLOR-CON) 10 MEQ tablet Take 10 mEq by mouth daily.    [provider]  rosuvastatin (CRESTOR) 5 MG tablet Take 1 tablet (5 mg total) by mouth daily at 6 PM. 04/06/14   Kristi Forward, MD    Family History Family History  Problem Relation Age of Onset  . Hypertension Other   . Coronary artery disease Other     Social History Social History   Tobacco Use  . Smoking status: Never Smoker  . Smokeless tobacco: Never Used  Substance Use Topics  . Alcohol use: Yes    Comment: glass of wine once or twice a month  . Drug use: No     Allergies   Avapro [irbesartan]; Avelox [moxifloxacin]; Excedrin extra strength [aspirin-acetaminophen-caffeine]; and Lipitor [atorvastatin]   Review of Systems Review of Systems  Constitutional: Negative for chills and fever.  HENT: Negative for sore throat and trouble swallowing.   Respiratory: Negative for shortness of breath.   Cardiovascular: Negative for chest pain.  Gastrointestinal: Negative for abdominal pain.  Musculoskeletal: Positive for neck pain.  Skin: Negative for color change and wound.  Neurological: Negative for weakness and numbness.     Physical Exam Updated Vital Signs BP (!) 159/77 (BP Location: Right Arm)   Pulse (!) 111   Temp 98.7 F (37.1 C) (Oral)   Resp 18   Ht 5\' 4"  (1.626 m)   Wt 92.1 kg (203 lb)   SpO2 98%   BMI 34.84 kg/m   Physical Exam  Constitutional: She is oriented to person, place, and time. She appears  well-developed and well-nourished. No distress.  HENT:  Head: Normocephalic and atraumatic.  Neck:  No midline tenderness. Tenderness to bilateral paraspinal musculature.  No meningeal signs.  No nuchal rigidity.  Cardiovascular: Normal rate, regular rhythm and normal heart sounds.  No murmur heard. Pulmonary/Chest: Effort normal and breath sounds normal. No respiratory distress.  Abdominal: Soft. She exhibits no distension. There is no tenderness.  Musculoskeletal: Normal range of motion.  5/5 muscle strength and full range of motion of  bilateral upper extremities.  Sensation intact and equal throughout.  Neurological: She is alert and oriented to person, place, and time.  Skin: Skin is warm and dry.  Nursing note and vitals reviewed.    ED Treatments / Results  Labs (all labs ordered are listed, but only abnormal results are displayed) Labs Reviewed - No data to display  EKG None  Radiology No results found.  Procedures Procedures (including critical care time)  Medications Ordered in ED Medications  ketorolac (TORADOL) 15 MG/ML injection 15 mg (15 mg Intramuscular Given 07/01/17 1616)     Initial Impression / Assessment and Plan / ED Course  I have reviewed the triage vital signs and the nursing notes.  Pertinent labs & imaging results that were available during my care of the patient were reviewed by me and considered in my medical decision making (see chart for details).    Kristi Bennett is a 67 y.o. female who presents to ED for persistent bilateral neck pain over the last 4 to 5 days.  Seen by primary care yesterday and diagnosed with muscle strain.  She was started on Advil and tramadol which have not helped.  She felt more pain to the left side of her neck today, prompting her to seek emergency care.  No fever or chills.  No meningeal signs to suggest infectious etiology.  No midline tenderness and no acute injury to suggest bony etiology.  No numbness or  tingling.  Full strength and sensation to upper extremities.  Exam is consistent with muscle strain/spasm.  Will add in Valium for muscle relaxation.  Continue NSAID and heat compresses recommended by PCP.  Encouraged her to call PCP tomorrow to schedule follow-up appointment for recheck.  Reasons to return to emergency department discussed and all questions answered.   Final Clinical Impressions(s) / ED Diagnoses   Final diagnoses:  Muscle spasms of neck    ED Discharge Orders        Ordered    diazepam (VALIUM) 2 MG tablet  2 times daily     07/01/17 1644       Guiliana Shor, Kristi Almond, PA-C 07/01/17 1656    Little, Wenda Overland, MD 07/02/17 [phone]

## 2017-07-01 NOTE — ED Notes (Signed)
Pt stable and ambulatory for discharge, states understanding follow up.  

## 2017-07-01 NOTE — ED Triage Notes (Signed)
Pt c/o "pulling pain" in neck since Sunday. Saw PCP yesterday and prescribed tramadol and advil for pain. States pain worse upon waking today, took 600mg  ibuprofen PTA. C/o pain radiating down back and pain with palpation. Denies fevers.

## 2017-07-01 NOTE — Discharge Instructions (Signed)
It was my pleasure taking care of you today!   Continue taking medications provided by your primary doctor for your neck pain.   In additional to this, I have given you a prescription for a medication that will help relax the neck muscles. This can make you sleepy, so take this about an hour before bed. You can take it in the morning as well, but only if you will be home (because it can make you drowsy).   Call your primary care doctor in the morning to schedule a follow up appointment for early next week to recheck your symptoms.   Return to ER for fever, numbness, weakness, new or worsening symptoms, any additional concerns.

## 2018-01-10 ENCOUNTER — Other Ambulatory Visit: Payer: Self-pay | Admitting: Cardiology

## 2018-01-10 DIAGNOSIS — R079 Chest pain, unspecified: Secondary | ICD-10-CM

## 2018-01-17 ENCOUNTER — Ambulatory Visit (HOSPITAL_COMMUNITY)
Admission: RE | Admit: 2018-01-17 | Discharge: 2018-01-17 | Disposition: A | Discharge status: Home / Self Care | Payer: Medicare HMO | Source: Ambulatory Visit | Attending: Cardiology | Admitting: Cardiology

## 2018-01-17 DIAGNOSIS — R079 Chest pain, unspecified: Secondary | ICD-10-CM | POA: Diagnosis not present

## 2018-01-17 MED ORDER — REGADENOSON 0.4 MG/5ML IV SOLN
INTRAVENOUS | Status: AC
  Filled 2018-01-17: qty 5

## 2018-01-17 MED ORDER — REGADENOSON 0.4 MG/5ML IV SOLN
0.4000 mg | Freq: Once | INTRAVENOUS | Status: AC
  Administered 2018-01-17: 0.4 mg via INTRAVENOUS

## 2018-01-17 MED ORDER — TECHNETIUM TC 99M TETROFOSMIN IV KIT
10.0000 | PACK | Freq: Once | INTRAVENOUS | Status: AC | PRN
  Administered 2018-01-17: 10 via INTRAVENOUS

## 2018-01-17 MED ORDER — TECHNETIUM TC 99M TETROFOSMIN IV KIT
30.0000 | PACK | Freq: Once | INTRAVENOUS | Status: AC | PRN
  Administered 2018-01-17: 30 via INTRAVENOUS

## 2018-02-11 MED FILL — PREVNAR 13 SYRINGE: 1 days supply | Qty: 1 | Fill #0

## 2018-02-17 MED FILL — SHINGRIX 50 MCG SUS: 50 | 1 days supply | Qty: 1 | Fill #0

## 2018-05-16 ENCOUNTER — Other Ambulatory Visit: Payer: Self-pay

## 2018-05-17 MED FILL — OZEMPIC 1 MG/DOSE SOPN: 2 | 28 days supply | Qty: 3 | Fill #0

## 2018-06-01 MED FILL — CITALOPRAM HBR 20 MG TABLET: 20 | 30 days supply | Qty: 30 | Fill #0

## 2018-06-02 MED FILL — metFORMIN HCL 1000 MG TABS: 1000 | 30 days supply | Qty: 60 | Fill #0

## 2018-06-10 MED FILL — LOSARTAN POTASSIUM 100 MG T: 100 | 90 days supply | Qty: 90 | Fill #0

## 2018-06-10 MED FILL — OZEMPIC 1 MG/DOSE SOPN: 2 | 28 days supply | Qty: 3 | Fill #1

## 2018-06-14 DIAGNOSIS — E785 Hyperlipidemia, unspecified: Secondary | ICD-10-CM | POA: Diagnosis not present

## 2018-06-14 DIAGNOSIS — M1990 Unspecified osteoarthritis, unspecified site: Secondary | ICD-10-CM | POA: Diagnosis not present

## 2018-06-14 DIAGNOSIS — I25118 Atherosclerotic heart disease of native coronary artery with other forms of angina pectoris: Secondary | ICD-10-CM | POA: Diagnosis not present

## 2018-06-14 DIAGNOSIS — I10 Essential (primary) hypertension: Secondary | ICD-10-CM | POA: Diagnosis not present

## 2018-06-14 DIAGNOSIS — E119 Type 2 diabetes mellitus without complications: Secondary | ICD-10-CM | POA: Diagnosis not present

## 2018-06-14 DIAGNOSIS — M109 Gout, unspecified: Secondary | ICD-10-CM | POA: Diagnosis not present

## 2018-06-14 DIAGNOSIS — J309 Allergic rhinitis, unspecified: Secondary | ICD-10-CM | POA: Diagnosis not present

## 2018-06-22 MED FILL — ROSUVASTATIN CALCIUM 5 MG T: 5 | 30 days supply | Qty: 30 | Fill #0

## 2018-07-04 MED FILL — OZEMPIC 1 MG/DOSE SOPN: 2 | 28 days supply | Qty: 3 | Fill #2

## 2018-07-04 MED FILL — metFORMIN HCL 1000 MG TABS: 1000 | 30 days supply | Qty: 60 | Fill #1

## 2018-07-04 MED FILL — CITALOPRAM HBR 20 MG TABLET: 20 | 30 days supply | Qty: 30 | Fill #1

## 2018-07-30 ENCOUNTER — Other Ambulatory Visit: Payer: Self-pay

## 2018-07-30 DIAGNOSIS — Z20822 Contact with and (suspected) exposure to covid-19: Secondary | ICD-10-CM

## 2018-08-01 LAB — NOVEL CORONAVIRUS, NAA: SARS-CoV-2, NAA: NOT DETECTED

## 2018-08-09 MED FILL — ROSUVASTATIN CALCIUM 5 MG T: 5 | 30 days supply | Qty: 30 | Fill #1

## 2018-08-18 DIAGNOSIS — I10 Essential (primary) hypertension: Secondary | ICD-10-CM | POA: Diagnosis not present

## 2018-08-18 DIAGNOSIS — E1165 Type 2 diabetes mellitus with hyperglycemia: Secondary | ICD-10-CM | POA: Diagnosis not present

## 2018-08-18 DIAGNOSIS — Z7189 Other specified counseling: Secondary | ICD-10-CM | POA: Diagnosis not present

## 2018-08-18 DIAGNOSIS — Z6834 Body mass index (BMI) 34.0-34.9, adult: Secondary | ICD-10-CM | POA: Diagnosis not present

## 2018-09-06 DIAGNOSIS — E1165 Type 2 diabetes mellitus with hyperglycemia: Secondary | ICD-10-CM | POA: Diagnosis not present

## 2018-09-06 DIAGNOSIS — F331 Major depressive disorder, recurrent, moderate: Secondary | ICD-10-CM | POA: Diagnosis not present

## 2018-09-06 DIAGNOSIS — M1000 Idiopathic gout, unspecified site: Secondary | ICD-10-CM | POA: Diagnosis not present

## 2018-09-06 DIAGNOSIS — I130 Hypertensive heart and chronic kidney disease with heart failure and stage 1 through stage 4 chronic kidney disease, or unspecified chronic kidney disease: Secondary | ICD-10-CM | POA: Diagnosis not present

## 2018-09-06 DIAGNOSIS — I10 Essential (primary) hypertension: Secondary | ICD-10-CM | POA: Diagnosis not present

## 2018-09-22 DIAGNOSIS — J309 Allergic rhinitis, unspecified: Secondary | ICD-10-CM | POA: Diagnosis not present

## 2018-09-22 DIAGNOSIS — I10 Essential (primary) hypertension: Secondary | ICD-10-CM | POA: Diagnosis not present

## 2018-09-22 DIAGNOSIS — E785 Hyperlipidemia, unspecified: Secondary | ICD-10-CM | POA: Diagnosis not present

## 2018-09-22 DIAGNOSIS — I25118 Atherosclerotic heart disease of native coronary artery with other forms of angina pectoris: Secondary | ICD-10-CM | POA: Diagnosis not present

## 2018-09-22 DIAGNOSIS — M1990 Unspecified osteoarthritis, unspecified site: Secondary | ICD-10-CM | POA: Diagnosis not present

## 2018-09-22 DIAGNOSIS — M109 Gout, unspecified: Secondary | ICD-10-CM | POA: Diagnosis not present

## 2018-09-22 DIAGNOSIS — E119 Type 2 diabetes mellitus without complications: Secondary | ICD-10-CM | POA: Diagnosis not present

## 2018-11-10 MED FILL — SHINGRIX 50 MCG SUS: 50 | 1 days supply | Qty: 1 | Fill #1

## 2018-12-20 DIAGNOSIS — I10 Essential (primary) hypertension: Secondary | ICD-10-CM | POA: Diagnosis not present

## 2018-12-20 DIAGNOSIS — R110 Nausea: Secondary | ICD-10-CM | POA: Diagnosis not present

## 2018-12-20 DIAGNOSIS — K219 Gastro-esophageal reflux disease without esophagitis: Secondary | ICD-10-CM | POA: Diagnosis not present

## 2018-12-20 DIAGNOSIS — R1110 Vomiting, unspecified: Secondary | ICD-10-CM | POA: Diagnosis not present

## 2018-12-20 DIAGNOSIS — E1165 Type 2 diabetes mellitus with hyperglycemia: Secondary | ICD-10-CM | POA: Diagnosis not present

## 2018-12-21 DIAGNOSIS — E1165 Type 2 diabetes mellitus with hyperglycemia: Secondary | ICD-10-CM | POA: Diagnosis not present

## 2018-12-21 DIAGNOSIS — K219 Gastro-esophageal reflux disease without esophagitis: Secondary | ICD-10-CM | POA: Diagnosis not present

## 2018-12-21 DIAGNOSIS — I10 Essential (primary) hypertension: Secondary | ICD-10-CM | POA: Diagnosis not present

## 2018-12-21 DIAGNOSIS — R110 Nausea: Secondary | ICD-10-CM | POA: Diagnosis not present

## 2018-12-21 DIAGNOSIS — R1110 Vomiting, unspecified: Secondary | ICD-10-CM | POA: Diagnosis not present

## 2019-01-03 ENCOUNTER — Other Ambulatory Visit: Payer: Self-pay | Admitting: Family Medicine

## 2019-01-03 DIAGNOSIS — I10 Essential (primary) hypertension: Secondary | ICD-10-CM | POA: Diagnosis not present

## 2019-01-03 DIAGNOSIS — R110 Nausea: Secondary | ICD-10-CM

## 2019-01-03 DIAGNOSIS — K829 Disease of gallbladder, unspecified: Secondary | ICD-10-CM

## 2019-01-05 ENCOUNTER — Ambulatory Visit
Admission: RE | Admit: 2019-01-05 | Discharge: 2019-01-05 | Disposition: A | Discharge status: Home / Self Care | Payer: Medicare HMO | Source: Ambulatory Visit | Attending: Family Medicine | Admitting: Family Medicine

## 2019-01-05 DIAGNOSIS — M109 Gout, unspecified: Secondary | ICD-10-CM | POA: Diagnosis not present

## 2019-01-05 DIAGNOSIS — R109 Unspecified abdominal pain: Secondary | ICD-10-CM | POA: Diagnosis not present

## 2019-01-05 DIAGNOSIS — F341 Dysthymic disorder: Secondary | ICD-10-CM | POA: Diagnosis not present

## 2019-01-05 DIAGNOSIS — E785 Hyperlipidemia, unspecified: Secondary | ICD-10-CM | POA: Diagnosis not present

## 2019-01-05 DIAGNOSIS — J309 Allergic rhinitis, unspecified: Secondary | ICD-10-CM | POA: Diagnosis not present

## 2019-01-05 DIAGNOSIS — K7689 Other specified diseases of liver: Secondary | ICD-10-CM | POA: Diagnosis not present

## 2019-01-05 DIAGNOSIS — I10 Essential (primary) hypertension: Secondary | ICD-10-CM | POA: Diagnosis not present

## 2019-01-05 DIAGNOSIS — K829 Disease of gallbladder, unspecified: Secondary | ICD-10-CM

## 2019-01-05 DIAGNOSIS — E119 Type 2 diabetes mellitus without complications: Secondary | ICD-10-CM | POA: Diagnosis not present

## 2019-01-05 DIAGNOSIS — I25118 Atherosclerotic heart disease of native coronary artery with other forms of angina pectoris: Secondary | ICD-10-CM | POA: Diagnosis not present

## 2019-01-05 DIAGNOSIS — R110 Nausea: Secondary | ICD-10-CM

## 2019-01-05 DIAGNOSIS — M1990 Unspecified osteoarthritis, unspecified site: Secondary | ICD-10-CM | POA: Diagnosis not present

## 2019-01-31 DIAGNOSIS — R110 Nausea: Secondary | ICD-10-CM | POA: Diagnosis not present

## 2019-01-31 DIAGNOSIS — F331 Major depressive disorder, recurrent, moderate: Secondary | ICD-10-CM | POA: Diagnosis not present

## 2019-01-31 DIAGNOSIS — N6310 Unspecified lump in the right breast, unspecified quadrant: Secondary | ICD-10-CM | POA: Diagnosis not present

## 2019-01-31 DIAGNOSIS — N6001 Solitary cyst of right breast: Secondary | ICD-10-CM | POA: Diagnosis not present

## 2019-01-31 DIAGNOSIS — E1165 Type 2 diabetes mellitus with hyperglycemia: Secondary | ICD-10-CM | POA: Diagnosis not present

## 2019-01-31 DIAGNOSIS — M130 Polyarthritis, unspecified: Secondary | ICD-10-CM | POA: Diagnosis not present

## 2019-07-13 ENCOUNTER — Other Ambulatory Visit (HOSPITAL_COMMUNITY): Payer: Self-pay | Admitting: General Practice

## 2019-07-13 ENCOUNTER — Other Ambulatory Visit: Payer: Self-pay | Admitting: General Practice

## 2019-07-13 DIAGNOSIS — R5381 Other malaise: Secondary | ICD-10-CM

## 2019-07-14 ENCOUNTER — Other Ambulatory Visit: Payer: Self-pay | Admitting: General Practice

## 2019-07-14 DIAGNOSIS — E2839 Other primary ovarian failure: Secondary | ICD-10-CM

## 2019-08-23 ENCOUNTER — Other Ambulatory Visit (HOSPITAL_COMMUNITY): Payer: Self-pay | Admitting: General Practice

## 2019-08-29 ENCOUNTER — Ambulatory Visit
Admission: RE | Admit: 2019-08-29 | Discharge: 2019-08-29 | Disposition: A | Discharge status: Home / Self Care | Payer: Medicare HMO | Source: Ambulatory Visit | Attending: General Practice | Admitting: General Practice

## 2019-08-29 ENCOUNTER — Other Ambulatory Visit: Payer: Self-pay

## 2019-08-29 DIAGNOSIS — E2839 Other primary ovarian failure: Secondary | ICD-10-CM

## 2019-09-27 ENCOUNTER — Emergency Department (HOSPITAL_COMMUNITY): Payer: Medicare HMO

## 2019-09-27 ENCOUNTER — Encounter (HOSPITAL_COMMUNITY): Payer: Self-pay | Admitting: *Deleted

## 2019-09-27 ENCOUNTER — Ambulatory Visit (INDEPENDENT_AMBULATORY_CARE_PROVIDER_SITE_OTHER)
Admission: EM | Admit: 2019-09-27 | Discharge: 2019-09-27 | Disposition: A | Discharge status: Home / Self Care | Payer: Medicare HMO | Source: Home / Self Care

## 2019-09-27 ENCOUNTER — Emergency Department (HOSPITAL_COMMUNITY)
Admission: EM | Admit: 2019-09-27 | Discharge: 2019-09-27 | Disposition: A | Discharge status: Home / Self Care | Payer: Medicare HMO | Attending: Emergency Medicine | Admitting: Emergency Medicine

## 2019-09-27 DIAGNOSIS — R079 Chest pain, unspecified: Secondary | ICD-10-CM | POA: Diagnosis not present

## 2019-09-27 DIAGNOSIS — E119 Type 2 diabetes mellitus without complications: Secondary | ICD-10-CM | POA: Diagnosis not present

## 2019-09-27 DIAGNOSIS — R0789 Other chest pain: Secondary | ICD-10-CM | POA: Diagnosis not present

## 2019-09-27 DIAGNOSIS — Z7984 Long term (current) use of oral hypoglycemic drugs: Secondary | ICD-10-CM | POA: Diagnosis not present

## 2019-09-27 DIAGNOSIS — R0602 Shortness of breath: Secondary | ICD-10-CM | POA: Insufficient documentation

## 2019-09-27 DIAGNOSIS — Z79899 Other long term (current) drug therapy: Secondary | ICD-10-CM | POA: Diagnosis not present

## 2019-09-27 DIAGNOSIS — I10 Essential (primary) hypertension: Secondary | ICD-10-CM | POA: Diagnosis not present

## 2019-09-27 DIAGNOSIS — J45909 Unspecified asthma, uncomplicated: Secondary | ICD-10-CM | POA: Insufficient documentation

## 2019-09-27 DIAGNOSIS — J939 Pneumothorax, unspecified: Secondary | ICD-10-CM | POA: Diagnosis not present

## 2019-09-27 HISTORY — DX: Pneumothorax, unspecified: J93.9

## 2019-09-27 LAB — TROPONIN I (HIGH SENSITIVITY)
Troponin I (High Sensitivity): 6 ng/L
Troponin I (High Sensitivity): 4 ng/L (ref <18)

## 2019-09-27 LAB — BASIC METABOLIC PANEL
Anion gap: 13 (ref 5–15)
BUN: 7 mg/dL — ABNORMAL LOW (ref 8–23)
CO2: 22 mmol/L (ref 22–32)
Calcium: 9 mg/dL (ref 8.9–10.3)
Chloride: 105 mmol/L (ref 98–111)
Creatinine, Ser: 0.46 mg/dL (ref 0.44–1.00)
GFR calc Af Amer: >60 mL/min (ref >60)
GFR calc non Af Amer: >60 mL/min (ref >60)
Glucose, Bld: 121 mg/dL — ABNORMAL HIGH (ref 70–99)
Potassium: 3.6 mmol/L (ref 3.5–5.1)
Sodium: 140 mmol/L (ref 135–145)

## 2019-09-27 LAB — CBC
HCT: 37.2 % (ref 36.0–46.0)
Hemoglobin: 11.9 g/dL — ABNORMAL LOW (ref 12.0–15.0)
MCH: 26.8 pg (ref 26.0–34.0)
MCHC: 32 g/dL (ref 30.0–36.0)
MCV: 83.8 fL (ref 80.0–100.0)
Platelets: 316 10*3/uL (ref 150–400)
RBC: 4.44 MIL/uL (ref 3.87–5.11)
RDW: 14 % (ref 11.5–15.5)
WBC: 6.3 10*3/uL (ref 4.0–10.5)
nRBC: 0 % (ref 0.0–0.2)

## 2019-09-27 LAB — HEPATIC FUNCTION PANEL
ALT: 13 U/L (ref 0–44)
AST: 18 U/L (ref 15–41)
Albumin: 3.4 g/dL — ABNORMAL LOW (ref 3.5–5.0)
Alkaline Phosphatase: 105 U/L (ref 38–126)
Bilirubin, Direct: <0.1 mg/dL (ref 0.0–0.2)
Total Bilirubin: 0.4 mg/dL (ref 0.3–1.2)
Total Protein: 6.7 g/dL (ref 6.5–8.1)

## 2019-09-27 LAB — LIPASE, BLOOD: Lipase: 42 U/L (ref 11–51)

## 2019-09-27 MED ORDER — ASPIRIN 81 MG PO CHEW
324.0000 mg | CHEWABLE_TABLET | Freq: Once | ORAL | Status: AC
  Administered 2019-09-27: 324 mg via ORAL

## 2019-09-27 MED ORDER — IOHEXOL 350 MG/ML SOLN
60.0000 mL | Freq: Once | INTRAVENOUS | Status: AC | PRN
  Administered 2019-09-27: 60 mL via INTRAVENOUS

## 2019-09-27 MED ORDER — NITROGLYCERIN 0.4 MG SL SUBL: 0.4000 mg | SUBLINGUAL_TABLET | Freq: Once | SUBLINGUAL | Status: DC

## 2019-09-27 NOTE — ED Notes (Signed)
Patient is being discharged from the Urgent Care and sent to the Emergency Department via POV . Per Cathlean Sauer patient is in need of higher level of care due to CP, SOB; r/o MI Patient is aware and verbalizes understanding of plan of care.  Vitals:   09/27/19 1027  BP: (!) 152/91  Pulse: 80  Resp: 20  Temp: 98.2 F (36.8 C)  SpO2: 96%

## 2019-09-27 NOTE — ED Triage Notes (Signed)
To ED for eval of cp that started yesterday. Pt took nitro with some relief. Seen at [facility] today and sent to ED for further eval. Complains of indigestion - no vomiting. States pains are sharp. Pt was given 4 81mg  asa at [facility]. Skin w/d, resp e/u. Pain is described as in center of chest and without radiation. Appears anxious. States she was told she makes too much money for housing when pain started.

## 2019-09-27 NOTE — ED Provider Notes (Signed)
[facility] EMERGENCY DEPARTMENT Provider Note   CSN: [id number] Arrival date & time: 09/27/19  1137     History Chief Complaint  Patient presents with  . Chest Pain    Kristi Bennett is a 69 y.o. female.  The history is provided by the patient.  Chest Pain Pain location:  L chest and R chest Pain quality: aching   Pain radiates to:  L arm Pain severity:  Mild Onset quality:  Gradual Duration:  2 days Timing:  Intermittent Progression:  Waxing and waning Chronicity:  New Context: lifting, movement and raising an arm   Relieved by:  Nothing Worsened by:  Nothing Associated symptoms: no abdominal pain, no back pain, no cough, no fever, no nausea, no palpitations, no shortness of breath and no vomiting   Risk factors: coronary artery disease, high cholesterol and hypertension        Past Medical History:  Diagnosis Date  . Allergic rhinitis   . Asthma   . Depression   . Diabetes mellitus without complication ([place])   . GERD (gastroesophageal reflux disease)   . Heart murmur   . Hyperlipidemia   . Hypertension   . Memory impairment   . Obesity   . Pneumothorax   . Sinusitis     Patient Active Problem List   Diagnosis Date Noted  . New-onset angina ([place]) 04/05/2014  . Chest tightness 07/24/2013  . Hypertension   . Hyperlipidemia   . Diabetes mellitus without complication ([place])   . GERD (gastroesophageal reflux disease)   . Obesity     Past Surgical History:  Procedure Laterality Date  . ABDOMINAL HYSTERECTOMY    . BREAST LUMPECTOMY    . HERNIA REPAIR    . LEFT HEART CATHETERIZATION WITH CORONARY ANGIOGRAM N/A 04/05/2014   Procedure: LEFT HEART CATHETERIZATION WITH CORONARY ANGIOGRAM;  Surgeon: Clent Demark, MD;  Location: [facility] CATH LAB;  Service: Cardiovascular;  Laterality: N/A;  . LEG SURGERY    . TUBAL LIGATION       OB History   No obstetric history on file.     Family History  Problem Relation Age of Onset  .  Hypertension Other   . Coronary artery disease Other     Social History   Tobacco Use  . Smoking status: Never Smoker  . Smokeless tobacco: Never Used  Substance Use Topics  . Alcohol use: Yes    Comment: glass of wine once or twice a month  . Drug use: No    Home Medications Prior to Admission medications   Medication Sig Start Date End Date Taking? Authorizing Provider  albuterol (PROVENTIL HFA;VENTOLIN HFA) 108 (90 BASE) MCG/ACT inhaler Inhale 2 puffs into the lungs every 6 (six) hours as needed for wheezing or shortness of breath.    Yes [provider]  amLODipine (NORVASC) 5 MG tablet Take 1 tablet (5 mg total) by mouth daily. 04/06/14  Yes Charolette Forward, MD  aspirin EC 81 MG tablet Take 81 mg by mouth daily at 2 PM daily at 2 PM.   Yes [provider]  citalopram (CELEXA) 10 MG tablet Take 10 mg by mouth daily.   Yes [provider]  losartan (COZAAR) 100 MG tablet Take 100 mg by mouth daily at 2 PM daily at 2 PM.    Yes [provider]  metFORMIN (GLUCOPHAGE-XR) 500 MG 24 hr tablet Take 500 mg by mouth daily. 07/13/19  Yes [provider]  montelukast (SINGULAIR) 10  MG tablet Take 10 mg by mouth at bedtime as needed (allergies).    Yes [provider]  naproxen sodium (ALEVE) 220 MG tablet Take 440 mg by mouth daily as needed.   Yes [provider]  nitroGLYCERIN (NITROSTAT) 0.4 MG SL tablet Place 1 tablet (0.4 mg total) under the tongue every 5 (five) minutes as needed for chest pain. 04/06/14  Yes Charolette Forward, MD  rosuvastatin (CRESTOR) 5 MG tablet Take 1 tablet (5 mg total) by mouth daily at 6 PM. 04/06/14  Yes Charolette Forward, MD    Allergies    Avapro [irbesartan], Avelox [moxifloxacin], Excedrin extra strength [aspirin-acetaminophen-caffeine], and Lipitor [atorvastatin]  Review of Systems   Review of Systems  Constitutional: Negative for chills and fever.  HENT: Negative for ear pain and sore throat.     Eyes: Negative for pain and visual disturbance.  Respiratory: Negative for cough and shortness of breath.   Cardiovascular: Positive for chest pain. Negative for palpitations.  Gastrointestinal: Negative for abdominal pain, nausea and vomiting.  Genitourinary: Negative for dysuria and hematuria.  Musculoskeletal: Negative for arthralgias and back pain.  Skin: Negative for color change and rash.  Neurological: Negative for seizures and syncope.  All other systems reviewed and are negative.   Physical Exam Updated Vital Signs  ED Triage Vitals [09/27/19 1150]  Enc Vitals Group     BP 138/73     Pulse Rate 72     Resp 16     Temp 98.7 F (37.1 C)     Temp Source Oral     SpO2 100 %     Weight      Height      Head Circumference      Peak Flow      Pain Score 8     Pain Loc      Pain Edu?      Excl. in [place]?     Physical Exam Vitals and nursing note reviewed.  Constitutional:      General: She is not in acute distress.    Appearance: She is well-developed. She is not ill-appearing.  HENT:     Head: Normocephalic and atraumatic.  Eyes:     Extraocular Movements: Extraocular movements intact.     Conjunctiva/sclera: Conjunctivae normal.     Pupils: Pupils are equal, round, and reactive to light.  Cardiovascular:     Rate and Rhythm: Normal rate and regular rhythm.     Pulses:          Radial pulses are 2+ on the right side and 2+ on the left side.     Heart sounds: Normal heart sounds. No murmur heard.   Pulmonary:     Effort: Pulmonary effort is normal. No respiratory distress.     Breath sounds: Normal breath sounds. No decreased breath sounds, wheezing, rhonchi or rales.  Chest:     Chest wall: Tenderness (TTP over sternum and left side of chest wall) present.  Abdominal:     Palpations: Abdomen is soft.     Tenderness: There is no abdominal tenderness.  Musculoskeletal:     Cervical back: Normal range of motion and neck supple.  Skin:    General: Skin is  warm and dry.     Capillary Refill: Capillary refill takes less than 2 seconds.  Neurological:     General: No focal deficit present.     Mental Status: She is alert.     ED Results / Procedures / Treatments  Labs (all labs ordered are listed, but only abnormal results are displayed) Labs Reviewed  BASIC METABOLIC PANEL - Abnormal; Notable for the following components:      Result Value   Glucose, Bld 121 (*)    BUN 7 (*)    All other components within normal limits  CBC - Abnormal; Notable for the following components:   Hemoglobin 11.9 (*)    All other components within normal limits  HEPATIC FUNCTION PANEL - Abnormal; Notable for the following components:   Albumin 3.4 (*)    All other components within normal limits  LIPASE, BLOOD  TROPONIN I (HIGH SENSITIVITY)  TROPONIN I (HIGH SENSITIVITY)    EKG None  Radiology DG Chest 2 View  Result Date: 09/27/2019 CLINICAL DATA:  Chest pain EXAM: CHEST - 2 VIEW COMPARISON:  None. FINDINGS: The heart size and mediastinal contours are within normal limits. Both lungs are clear. No pleural effusion or pneumothorax. The visualized skeletal structures are unremarkable. IMPRESSION: No acute process in the chest. Electronically Signed   By: Macy Mis M.D.   On: 09/27/2019 12:40   CT Angio Chest PE W and/or Wo Contrast  Result Date: 09/27/2019 CLINICAL DATA:  69 year old female with shortness of breath and dizziness. EXAM: CT ANGIOGRAPHY CHEST WITH CONTRAST TECHNIQUE: Multidetector CT imaging of the chest was performed using the standard protocol during bolus administration of intravenous contrast. Multiplanar CT image reconstructions and MIPs were obtained to evaluate the vascular anatomy. CONTRAST:  37mL OMNIPAQUE IOHEXOL 350 MG/ML SOLN COMPARISON:  Chest radiographs earlier tonight. Chest CTA 04/22/2003. FINDINGS: Cardiovascular: Excellent contrast bolus timing in the pulmonary arterial tree. No focal filling defect identified in the  pulmonary arteries to suggest acute pulmonary embolism. Left coronary artery stent suspected. Cardiac size stable since 2005 and within normal limits. No pericardial effusion. Little contrast in the aorta today. Mediastinum/Nodes: Small chronic gastric hiatal hernia. Otherwise negative. No mediastinal lymphadenopathy. Lungs/Pleura: Major airways are patent. There is mild chronic scarring in the left lower lobe anteriorly near the major fissure, stable since 2005. Negative lungs elsewhere. No pleural effusion. Upper Abdomen: Negative visible liver, gallbladder, spleen, pancreas, adrenal glands, kidneys and bowel in the upper abdomen. Musculoskeletal: No acute osseous abnormality identified. Review of the MIP images confirms the above findings. IMPRESSION: 1. Negative for acute pulmonary embolus. 2. No acute findings in the chest. Mild chronic left lower lobe scarring and small hiatal hernia. Electronically Signed   By: Genevie Ann M.D.   On: 09/27/2019 22:55    Procedures Procedures (including critical care time)  Medications Ordered in ED Medications  iohexol (OMNIPAQUE) 350 MG/ML injection 60 mL (60 mLs Intravenous Contrast Given 09/27/19 2248)    ED Course  I have reviewed the triage vital signs and the nursing notes.  Pertinent labs & imaging results that were available during my care of the patient were reviewed by me and considered in my medical decision making (see chart for details).    MDM Rules/Calculators/A&P                          Kristi Bennett is a 69 year old female with history of CAD, diabetes, high cholesterol, hypertension, pneumothorax who comes into the ED with chest pain.  Patient with normal vitals.  No fever.  Pain on and off the last several days.  Works as a [employer].  EKG shows sinus rhythm.  No obvious new ischemic changes.  EKG is overall unchanged from prior.  Troponin negative x2.  Chest x-ray without any signs of pneumonia, no pneumothorax.  No significant anemia,  electrolyte abnormality, kidney injury.  Patient has clear breath sounds on exam.  There does appear to be some reproducible chest wall pain on exam.  However most of her pain is when she takes a deep breath in.  She does not have any obvious PE risk factors.  Still suspect likely MSK source but will get a CT scan of her chest to rule out PE and possibly an occult pneumothorax.  Vital signs are normal.  Story is overall atypical for ACS.  Will get CT scan to rule out PE and pneumothorax and if normal anticipate discharge with anti-inflammatories.  CTA of chest shows no pulmonary embolism, no pneumothorax, no infectious process.  Overall suspect MSK type pain.  Recommend anti-inflammatories and Tylenol at home.  Understands return precautions and discharged in ED in good condition.  This chart was dictated using voice recognition software.  Despite best efforts to proofread,  errors can occur which can change the documentation meaning.    Final Clinical Impression(s) / ED Diagnoses Final diagnoses:  Atypical chest pain    Rx / DC Orders ED Discharge Orders    None       Lennice Sites, DO 09/27/19 2332

## 2019-09-27 NOTE — ED Notes (Signed)
Pt able to get in touch with family member, who informed that he would be here soon.

## 2019-09-27 NOTE — ED Provider Notes (Signed)
69 year old female with history of CAD, DM, HLD, HTN, pneumothorax comes in for 2 day history of left sided chest pain that radiates to the back. Complains of associated shortness of breath, dizziness, diaphoresis.  Describes pain as pressure/sharp/spasm, worse with inhalation.  BP 152/91 HR 80 RR 20 O2 96% Temp 98.2 Constitutional: nontoxic, no respiratory distress Heart: RRR Lungs: CTAB Skin: no diaphoresis Neuro: alert and oriented x 4. Able to ambulate on own.  EKG NSR 73bpm, nonspecific T wave changes compared to prior. However, given history, patient will require further evaluation at the ED. Patient drove herself, would like family member to drive her. Current stable. Discussed will need to call 911 if symptoms worsens along the way.    Ok Edwards, PA-C 09/27/19 1103

## 2019-09-27 NOTE — ED Triage Notes (Signed)
Pt c/o central and left sternal CP radiating to back onset yesterday with SOB, dizziness, and reported diaphoresis. Pt describes CP as pressure, sharp, spasm. Also reports she had "indigestion" yesterday.  EKG performed and results given to [employer]. Pt drove herself. Instructed pt to call family to pick her up and take her to ED stat for higher level eval/tx.

## 2019-09-27 NOTE — ED Notes (Signed)
Discharge instructions reviewed with pt. Pt verbalized understanding.   

## 2019-09-27 NOTE — Discharge Instructions (Addendum)
69 year old female with history of CAD, DM, HLD, HTN, pneumothorax comes in for 2 day history of left sided chest pain that radiates to the back. Complains of associated shortness of breath, dizziness, diaphoresis.  Describes pain as pressure/sharp/spasm, worse with inhalation.  BP 152/91 HR 80 RR 20 O2 96% Temp 98.2 Constitutional: nontoxic, no respiratory distress Heart: RRR Lungs: CTAB Skin: no diaphoresis Neuro: alert and oriented x 4. Able to ambulate on own.  Patient took nitro yesterday without relief. Have not taken aspirin today, will give 324mg  aspirin (anaphylactic reaction to Excedrin, but able to take aspirin)  EKG NSR 73bpm, nonspecific T wave changes compared to prior. However, given history, patient will require further evaluation at the ED. Patient drove herself, would like family member to drive her. Current stable. Discussed will need to call 911 if symptoms worsens along the way.

## 2019-10-03 ENCOUNTER — Ambulatory Visit
Admission: EM | Admit: 2019-10-03 | Discharge: 2019-10-03 | Disposition: A | Discharge status: Home / Self Care | Payer: Medicare HMO | Attending: Physician Assistant | Admitting: Physician Assistant

## 2019-10-03 DIAGNOSIS — Z1152 Encounter for screening for COVID-19: Secondary | ICD-10-CM | POA: Diagnosis not present

## 2019-10-03 NOTE — ED Triage Notes (Signed)
Pt requesting covid testing. States was in the ER last week. Pt denies s/sx's. States has been covid vaccinated.

## 2019-10-03 NOTE — Discharge Instructions (Signed)

## 2019-10-04 LAB — SARS-COV-2, NAA 2 DAY TAT

## 2019-10-04 LAB — NOVEL CORONAVIRUS, NAA: SARS-CoV-2, NAA: NOT DETECTED

## 2019-11-07 ENCOUNTER — Other Ambulatory Visit (HOSPITAL_COMMUNITY): Payer: Self-pay | Admitting: Cardiology

## 2020-01-03 ENCOUNTER — Other Ambulatory Visit (HOSPITAL_COMMUNITY): Payer: Self-pay | Admitting: Internal Medicine

## 2020-01-03 ENCOUNTER — Ambulatory Visit: Payer: Medicare HMO | Attending: Internal Medicine

## 2020-01-03 DIAGNOSIS — Z23 Encounter for immunization: Secondary | ICD-10-CM

## 2020-01-03 NOTE — Progress Notes (Signed)
   Covid-19 Vaccination Clinic  Name:  Kristi Bennett    MRN: [id number] DOB: 08-31-1950  01/03/2020  Kristi Bennett was observed post Covid-19 immunization for 15 minutes without incident. She was provided with Vaccine Information Sheet and instruction to access the V-Safe system.   Kristi Bennett was instructed to call 911 with any severe reactions post vaccine: Marland Kitchen Difficulty breathing  . Swelling of face and throat  . A fast heartbeat  . A bad rash all over body  . Dizziness and weakness   Immunizations Administered    Name Date Dose VIS Date Route   Pfizer COVID-19 Vaccine 01/03/2020  1:38 PM 0.3 mL 12/06/2019 Intramuscular   Manufacturer: [place]   Lot: [id number]   NDC: [address]-3

## 2020-02-12 ENCOUNTER — Other Ambulatory Visit (HOSPITAL_COMMUNITY): Payer: Self-pay | Admitting: Cardiology

## 2020-02-28 ENCOUNTER — Other Ambulatory Visit (HOSPITAL_COMMUNITY): Payer: Self-pay | Admitting: General Practice

## 2020-03-28 ENCOUNTER — Other Ambulatory Visit (HOSPITAL_COMMUNITY): Payer: Self-pay | Admitting: Student

## 2020-05-24 ENCOUNTER — Other Ambulatory Visit: Payer: Self-pay | Admitting: Family Medicine

## 2020-05-24 ENCOUNTER — Other Ambulatory Visit (HOSPITAL_COMMUNITY): Payer: Self-pay

## 2020-05-24 ENCOUNTER — Ambulatory Visit
Admission: RE | Admit: 2020-05-24 | Discharge: 2020-05-24 | Disposition: A | Discharge status: Home / Self Care | Payer: Medicare HMO | Source: Ambulatory Visit | Attending: *Deleted | Admitting: *Deleted

## 2020-05-24 ENCOUNTER — Other Ambulatory Visit: Payer: Self-pay | Admitting: *Deleted

## 2020-05-24 DIAGNOSIS — R059 Cough, unspecified: Secondary | ICD-10-CM

## 2020-05-24 MED ORDER — ALBUTEROL SULFATE HFA 108 (90 BASE) MCG/ACT IN AERS
1.0000 | INHALATION_SPRAY | Freq: Four times a day (QID) | RESPIRATORY_TRACT | 1 refills | Status: DC
  Filled 2020-05-24: qty 18, 50d supply, fill #0

## 2020-05-24 MED ORDER — PREDNISONE 5 MG PO TABS
ORAL_TABLET | ORAL | 0 refills | Status: DC
  Filled 2020-05-24: qty 21, 6d supply, fill #0

## 2020-05-27 ENCOUNTER — Other Ambulatory Visit (HOSPITAL_COMMUNITY): Payer: Self-pay

## 2020-05-28 ENCOUNTER — Other Ambulatory Visit (HOSPITAL_COMMUNITY): Payer: Self-pay

## 2020-05-28 MED ORDER — AMOXICILLIN-POT CLAVULANATE 875-125 MG PO TABS
1.0000 | ORAL_TABLET | Freq: Two times a day (BID) | ORAL | 0 refills | Status: AC
  Filled 2020-05-28: qty 20, 10d supply, fill #0

## 2020-05-31 ENCOUNTER — Other Ambulatory Visit (HOSPITAL_COMMUNITY): Payer: Self-pay | Admitting: Cardiology

## 2020-05-31 ENCOUNTER — Other Ambulatory Visit (HOSPITAL_COMMUNITY): Payer: Self-pay

## 2020-05-31 MED FILL — Semaglutide Soln Pen-inj 0.25 or 0.5 MG/DOSE (2 MG/1.5ML): SUBCUTANEOUS | 42 days supply | Qty: 1.5 | Fill #0 | Status: AC

## 2020-06-04 ENCOUNTER — Other Ambulatory Visit (HOSPITAL_COMMUNITY): Payer: Self-pay | Admitting: Cardiology

## 2020-06-04 ENCOUNTER — Other Ambulatory Visit (HOSPITAL_COMMUNITY): Payer: Self-pay

## 2020-06-04 MED ORDER — PEG 3350-KCL-NA BICARB-NACL 420 G PO SOLR
ORAL | 0 refills | Status: AC
  Filled 2020-06-04: qty 4000, 1d supply, fill #0

## 2020-06-05 ENCOUNTER — Other Ambulatory Visit (HOSPITAL_COMMUNITY): Payer: Self-pay

## 2020-06-06 ENCOUNTER — Other Ambulatory Visit (HOSPITAL_COMMUNITY): Payer: Self-pay

## 2020-06-06 ENCOUNTER — Other Ambulatory Visit (HOSPITAL_COMMUNITY): Payer: Self-pay | Admitting: Cardiology

## 2020-06-06 MED ORDER — CITALOPRAM HYDROBROMIDE 20 MG PO TABS
ORAL_TABLET | Freq: Every day | ORAL | 3 refills | Status: DC
  Filled 2020-06-06: qty 30, 30d supply, fill #0
  Filled 2020-06-28 – 2020-07-08 (×2): qty 30, 30d supply, fill #1
  Filled 2020-08-06: qty 30, 30d supply, fill #2
  Filled 2020-09-03: qty 30, 30d supply, fill #0
  Filled 2020-09-03: qty 30, 30d supply, fill #3

## 2020-06-27 ENCOUNTER — Other Ambulatory Visit (HOSPITAL_COMMUNITY): Payer: Self-pay

## 2020-06-28 ENCOUNTER — Other Ambulatory Visit (HOSPITAL_COMMUNITY): Payer: Self-pay

## 2020-06-28 MED FILL — Metformin HCl Tab ER 24HR 500 MG: ORAL | 90 days supply | Qty: 90 | Fill #0 | Status: AC

## 2020-06-28 MED FILL — Rosuvastatin Calcium Tab 5 MG: ORAL | 90 days supply | Qty: 90 | Fill #0 | Status: AC

## 2020-07-08 ENCOUNTER — Other Ambulatory Visit (HOSPITAL_COMMUNITY): Payer: Self-pay

## 2020-07-30 ENCOUNTER — Other Ambulatory Visit (HOSPITAL_COMMUNITY): Payer: Self-pay

## 2020-07-30 MED ORDER — PREDNISOLONE ACETATE 1 % OP SUSP
OPHTHALMIC | 1 refills | Status: DC
  Filled 2020-07-30: qty 5, 25d supply, fill #0
  Filled 2020-09-03: qty 5, 25d supply, fill #1

## 2020-07-31 ENCOUNTER — Other Ambulatory Visit: Payer: Self-pay | Admitting: Ophthalmology

## 2020-07-31 ENCOUNTER — Ambulatory Visit
Admission: RE | Admit: 2020-07-31 | Discharge: 2020-07-31 | Disposition: A | Discharge status: Home / Self Care | Payer: Medicare HMO | Source: Ambulatory Visit | Attending: Ophthalmology | Admitting: Ophthalmology

## 2020-07-31 DIAGNOSIS — H209 Unspecified iridocyclitis: Secondary | ICD-10-CM

## 2020-08-06 ENCOUNTER — Other Ambulatory Visit (HOSPITAL_COMMUNITY): Payer: Self-pay

## 2020-08-06 MED FILL — Semaglutide Soln Pen-inj 0.25 or 0.5 MG/DOSE (2 MG/1.5ML): SUBCUTANEOUS | 42 days supply | Qty: 1.5 | Fill #1 | Status: AC

## 2020-08-20 ENCOUNTER — Other Ambulatory Visit (HOSPITAL_COMMUNITY): Payer: Self-pay

## 2020-08-20 MED FILL — Losartan Potassium Tab 100 MG: ORAL | 90 days supply | Qty: 90 | Fill #0 | Status: AC

## 2020-08-20 MED FILL — Amlodipine Besylate Tab 10 MG (Base Equivalent): ORAL | 90 days supply | Qty: 90 | Fill #0 | Status: AC

## 2020-09-03 ENCOUNTER — Other Ambulatory Visit (HOSPITAL_COMMUNITY): Payer: Self-pay

## 2020-09-09 ENCOUNTER — Other Ambulatory Visit (HOSPITAL_COMMUNITY): Payer: Self-pay

## 2020-09-09 MED ORDER — CITALOPRAM HYDROBROMIDE 20 MG PO TABS
20.0000 mg | ORAL_TABLET | Freq: Every day | ORAL | 3 refills | Status: DC
  Filled 2020-09-09: qty 30, 30d supply, fill #0
  Filled 2020-10-10: qty 30, 30d supply, fill #1
  Filled 2020-11-10: qty 30, 30d supply, fill #2
  Filled 2020-12-12: qty 30, 30d supply, fill #3

## 2020-09-20 ENCOUNTER — Other Ambulatory Visit (HOSPITAL_COMMUNITY): Payer: Self-pay

## 2020-09-20 MED ORDER — PREDNISOLONE ACETATE 1 % OP SUSP
1.0000 [drp] | Freq: Every evening | OPHTHALMIC | 1 refills | Status: AC
  Filled 2020-09-20: qty 5, 90d supply, fill #0

## 2020-09-27 MED FILL — Semaglutide Soln Pen-inj 0.25 or 0.5 MG/DOSE (2 MG/1.5ML): SUBCUTANEOUS | 42 days supply | Qty: 1.5 | Fill #2 | Status: CN

## 2020-09-30 ENCOUNTER — Other Ambulatory Visit (HOSPITAL_COMMUNITY): Payer: Self-pay

## 2020-09-30 MED FILL — Semaglutide Soln Pen-inj 0.25 or 0.5 MG/DOSE (2 MG/1.5ML): SUBCUTANEOUS | 56 days supply | Qty: 1.5 | Fill #0 | Status: CN

## 2020-10-08 ENCOUNTER — Other Ambulatory Visit (HOSPITAL_COMMUNITY): Payer: Self-pay

## 2020-10-10 ENCOUNTER — Other Ambulatory Visit (HOSPITAL_COMMUNITY): Payer: Self-pay

## 2020-10-10 MED FILL — Semaglutide Soln Pen-inj 0.25 or 0.5 MG/DOSE (2 MG/1.5ML): SUBCUTANEOUS | 56 days supply | Qty: 1.5 | Fill #0 | Status: AC

## 2020-10-11 ENCOUNTER — Other Ambulatory Visit (HOSPITAL_COMMUNITY): Payer: Self-pay

## 2020-10-11 MED ORDER — METFORMIN HCL ER 500 MG PO TB24
500.0000 mg | ORAL_TABLET | Freq: Every evening | ORAL | 3 refills | Status: DC
  Filled 2020-10-11: qty 90, 90d supply, fill #0
  Filled 2021-01-20: qty 90, 90d supply, fill #1
  Filled 2021-05-09: qty 90, 90d supply, fill #2
  Filled 2021-08-13: qty 90, 90d supply, fill #3

## 2020-10-15 ENCOUNTER — Other Ambulatory Visit (HOSPITAL_COMMUNITY): Payer: Self-pay

## 2020-10-25 ENCOUNTER — Other Ambulatory Visit (HOSPITAL_COMMUNITY): Payer: Self-pay

## 2020-11-06 ENCOUNTER — Other Ambulatory Visit (HOSPITAL_COMMUNITY): Payer: Self-pay

## 2020-11-06 ENCOUNTER — Ambulatory Visit (INDEPENDENT_AMBULATORY_CARE_PROVIDER_SITE_OTHER): Payer: Medicare HMO

## 2020-11-06 ENCOUNTER — Ambulatory Visit
Admission: EM | Admit: 2020-11-06 | Discharge: 2020-11-06 | Disposition: A | Discharge status: Home / Self Care | Payer: Medicare HMO

## 2020-11-06 ENCOUNTER — Encounter: Payer: Self-pay | Admitting: Emergency Medicine

## 2020-11-06 ENCOUNTER — Other Ambulatory Visit: Payer: Self-pay

## 2020-11-06 DIAGNOSIS — M79672 Pain in left foot: Secondary | ICD-10-CM

## 2020-11-06 MED ORDER — ROSUVASTATIN CALCIUM 5 MG PO TABS
5.0000 mg | ORAL_TABLET | Freq: Every day | ORAL | 3 refills | Status: DC
  Filled 2020-11-06: qty 90, 90d supply, fill #0
  Filled 2021-01-20: qty 90, 90d supply, fill #1
  Filled 2021-05-10: qty 90, 90d supply, fill #2
  Filled 2021-08-24: qty 90, 90d supply, fill #3

## 2020-11-06 NOTE — ED Triage Notes (Signed)
Foot pain starting last night and worsening into today. Today can't walk on it due to pain. Hx of plate and screws in left foot. Patient believes the plate may have shifted. Denies injury

## 2020-11-06 NOTE — Discharge Instructions (Addendum)
No fracture on xray. Recommend follow up with PCP or ortho regarding concerns for hardware placement shifts.

## 2020-11-06 NOTE — ED Provider Notes (Signed)
[facility]    CSN: [id number] Arrival date & time: 11/06/20  1034      History   Chief Complaint Chief Complaint  Patient presents with   Foot Pain    HPI Kristi Bennett is a 70 y.o. female.   Patient here today for evaluation of pain in her left midfoot that she first noticed yesterday.  She states initially she just had some mild swelling to her dorsal midfoot and some tenderness to this area, but this morning she had difficulty walking due to pain to the sole of her foot in the same area.  She has had prior surgery on her foot and is concerned that some of the plates and screws may have shifted.  She has not had any recent injury.  She does admit to wearing new shoes that are little softer than her prior shoes.  She denies any numbness or tingling.  She has tried wrapping her foot but continues to have pain with weightbearing.   Foot Pain Pertinent negatives include no shortness of breath.   Past Medical History:  Diagnosis Date   Allergic rhinitis    Asthma    Depression    Diabetes mellitus without complication (HCC)    GERD (gastroesophageal reflux disease)    Heart murmur    Hyperlipidemia    Hypertension    Memory impairment    Obesity    Pneumothorax    Sinusitis     Patient Active Problem List   Diagnosis Date Noted   New-onset angina ([place]) 04/05/2014   Chest tightness 07/24/2013   Hypertension    Hyperlipidemia    Diabetes mellitus without complication (HCC)    GERD (gastroesophageal reflux disease)    Obesity     Past Surgical History:  Procedure Laterality Date   ABDOMINAL HYSTERECTOMY     BREAST LUMPECTOMY     HERNIA REPAIR     LEFT HEART CATHETERIZATION WITH CORONARY ANGIOGRAM N/A 04/05/2014   Procedure: LEFT HEART CATHETERIZATION WITH CORONARY ANGIOGRAM;  Surgeon: Clent Demark, MD;  Location: [facility];  Service: Cardiovascular;  Laterality: N/A;   LEG SURGERY     TUBAL LIGATION      OB History   No obstetric history  on file.      Home Medications    Prior to Admission medications   Medication Sig Start Date End Date Taking? Authorizing Provider  amLODipine (NORVASC) 10 MG tablet TAKE 1 TABLET BY MOUTH EVERY NIGHT AT BEDTIME 02/12/20 02/11/21 Yes Charolette Forward, MD  aspirin EC 81 MG tablet Take 81 mg by mouth daily at 2 PM daily at 2 PM.   Yes [provider]  cetirizine (ZYRTEC) 10 MG tablet Take 10 mg by mouth daily.   Yes [provider]  citalopram (CELEXA) 20 MG tablet Take 1 tablet (20 mg total) by mouth daily. 09/09/20  Yes   losartan (COZAAR) 100 MG tablet TAKE 1 TABLET BY MOUTH EVERY DAY 02/12/20 02/11/21 Yes Charolette Forward, MD  metFORMIN (GLUCOPHAGE) 1000 MG tablet Take 1,000 mg by mouth 2 (two) times daily with a meal.   Yes [provider]  metFORMIN (GLUCOPHAGE-XR) 500 MG 24 hr tablet Take 1 tablet (500 mg total) by mouth once a day with evening meal Patient taking differently: Take 1,000 mg by mouth every evening. 10/11/20  Yes   rosuvastatin (CRESTOR) 5 MG tablet Take 1 tablet (5 mg total) by mouth daily at 6 PM. 04/06/14  Yes Charolette Forward, MD  Semaglutide,0.25 or 0.5MG /DOS, 2 MG/1.5ML SOPN Inject 0.25mg  under the skin once weekly.  Rotate injection sites each week. 02/28/20 02/27/21 Yes Patel, Nilay, DO  albuterol (PROVENTIL HFA;VENTOLIN HFA) 108 (90 BASE) MCG/ACT inhaler Inhale 2 puffs into the lungs every 6 (six) hours as needed for wheezing or shortness of breath.     [provider]  albuterol (VENTOLIN HFA) 108 (90 Base) MCG/ACT inhaler Inhale 1 puff into the lungs every 6 (six) hours. 05/24/20     amLODipine (NORVASC) 10 MG tablet TAKE ONE TABLET BY MOUTH EVERY NIGHT AT BEDTIME 11/07/19 11/06/20  Charolette Forward, MD  amLODipine (NORVASC) 5 MG tablet Take 1 tablet (5 mg total) by mouth daily. 04/06/14   Charolette Forward, MD  amoxicillin-clavulanate (AUGMENTIN) 875-125 MG tablet Take 1 tablet by mouth every 12 (twelve) hours for 10 days. 05/28/20     buPROPion  (WELLBUTRIN) 100 MG tablet TAKE 1 TABLET BY MOUTH DAILY FOR 30 DAYS 03/28/20 03/28/21  Cipriano Mile, NP  citalopram (CELEXA) 10 MG tablet Take 10 mg by mouth daily.    [provider]  COVID-19 mRNA vaccine, Pfizer, 30 MCG/0.3ML injection USE AS DIRECTED 01/03/20 01/02/21  Carlyle Basques, MD  glucose blood test strip CHECK BLOOD SUGAR TWICE A DAY 02/28/20 02/27/21  Andree Moro, DO  glucose blood test strip USE AS DIRECTED TWICE DAILY 02/28/20 02/27/21  Charolette Forward, MD  losartan (COZAAR) 100 MG tablet Take 100 mg by mouth daily at 2 PM daily at 2 PM.     [provider]  metFORMIN (GLUCOPHAGE-XR) 500 MG 24 hr tablet Take 500 mg by mouth daily. 07/13/19   [provider]  montelukast (SINGULAIR) 10 MG tablet Take 10 mg by mouth at bedtime as needed (allergies).     [provider]  naproxen sodium (ALEVE) 220 MG tablet Take 440 mg by mouth daily as needed.    [provider]  nitroGLYCERIN (NITROSTAT) 0.4 MG SL tablet Place 1 tablet (0.4 mg total) under the tongue every 5 (five) minutes as needed for chest pain. 04/06/14   Charolette Forward, MD  polyethylene glycol-electrolytes (NULYTELY) 420 g solution Take as directed 04/24/20     prednisoLONE acetate (PRED FORTE) 1 % ophthalmic suspension Place 1 drop into the left eye every day at bedtime 09/20/20     predniSONE (DELTASONE) 5 MG tablet TAKE AS DIRECTED FOR 6 DAYS TAKE ALL 6 TABLETS BY MOUTH ON DAY 1, THEN DECREASE BY ONE TABLET EACH DAY (6-5-4-3-2-1) 03/28/20 03/28/21  Cipriano Mile, NP  predniSONE (DELTASONE) 5 MG tablet Take 6 tablets by mouth on day 1 and decrease by 1 tablet per day for the next 5 days (6-5-4-3-2-1) 05/24/20     rosuvastatin (CRESTOR) 5 MG tablet TAKE ONE (1) TABLET BY MOUTH EVERY DAY AT 6:00PM 01/03/20 01/02/21  Charolette Forward, MD  rosuvastatin (CRESTOR) 5 MG tablet Take 1 tablet (5 mg total) by mouth daily at 6 PM. 11/06/20       Family History Family History  Problem Relation Age of Onset    Hypertension Other    Coronary artery disease Other     Social History Social History   Tobacco Use   Smoking status: Never   Smokeless tobacco: Never  Substance Use Topics   Alcohol use: Yes    Comment: glass of wine once or twice a month   Drug use: No     Allergies   Avapro [irbesartan], Avelox [moxifloxacin], Excedrin extra strength [aspirin-acetaminophen-caffeine], and Lipitor [atorvastatin]   Review  of Systems Review of Systems  Constitutional:  Negative for chills and fever.  Eyes:  Negative for discharge and redness.  Respiratory:  Negative for shortness of breath.   Musculoskeletal:  Positive for arthralgias, gait problem and joint swelling.  Skin:  Negative for color change and wound.  Neurological:  Negative for numbness.    Physical Exam Triage Vital Signs ED Triage Vitals  Enc Vitals Group     BP 11/06/20 1112 (!) 149/65     Pulse Rate 11/06/20 1112 71     Resp 11/06/20 1112 18     Temp 11/06/20 1112 98 F (36.7 C)     Temp Source 11/06/20 1112 Oral     SpO2 11/06/20 1112 97 %     Weight --      Height --      Head Circumference --      Peak Flow --      Pain Score 11/06/20 1114 6     Pain Loc --      Pain Edu? --      Excl. in [place]? --    No data found.  Updated Vital Signs BP (!) 149/65 (BP Location: Left Arm)   Pulse 71   Temp 98 F (36.7 C) (Oral)   Resp 18   SpO2 97%       Physical Exam Vitals and nursing note reviewed.  Constitutional:      General: She is not in acute distress.    Appearance: Normal appearance. She is not ill-appearing.  HENT:     Head: Normocephalic and atraumatic.  Eyes:     Conjunctiva/sclera: Conjunctivae normal.  Cardiovascular:     Rate and Rhythm: Normal rate.  Pulmonary:     Effort: Pulmonary effort is normal.  Musculoskeletal:     Comments: Full range of motion of left ankle and toes.  Some pain in the left midfoot with movement of left toes.  Tenderness to palpation noted to area of swelling  to dorsal left midfoot.  No erythema or bruising noted.  Skin:    General: Skin is warm and dry.  Neurological:     Mental Status: She is alert.     Comments: Gross sensation intact to left toes distally  Psychiatric:        Mood and Affect: Mood normal.        Behavior: Behavior normal.        Thought Content: Thought content normal.     UC Treatments / Results  Labs (all labs ordered are listed, but only abnormal results are displayed) Labs Reviewed - No data to display  EKG   Radiology DG Foot Complete Left  Result Date: 11/06/2020 CLINICAL DATA:  Left foot pain EXAM: LEFT FOOT - COMPLETE 3 VIEW COMPARISON:  None. FINDINGS: No acute fracture or dislocation. Partially imaged prior plate and screw fixation of the distal fibula. Moderate degenerative changes of the midfoot and first MTP joint. Soft tissues are unremarkable. IMPRESSION: No acute osseous abnormality. Electronically Signed   By: Yetta Glassman M.D.   On: 11/06/2020 12:20    Procedures Procedures (including critical care time)  Medications Ordered in UC Medications - No data to display  Initial Impression / Assessment and Plan / UC Course  I have reviewed the triage vital signs and the nursing notes.  Pertinent labs & imaging results that were available during my care of the patient were reviewed by me and considered in my medical decision making (see chart for  details).    Xray ordered- no fracture noted. I recommended she follow up with her orthopedist regarding concerns for shifts in hardware, etc as she may need further imaging. I recommended she return to a more supportive shoe and discussed that changes in shoes may have also caused symptoms.   Final Clinical Impressions(s) / UC Diagnoses   Final diagnoses:  Left foot pain     Discharge Instructions      No fracture on xray. Recommend follow up with PCP or ortho regarding concerns for hardware placement shifts.      ED Prescriptions   None     PDMP not reviewed this encounter.   Francene Finders, PA-C 11/06/20 1232

## 2020-11-11 ENCOUNTER — Other Ambulatory Visit (HOSPITAL_COMMUNITY): Payer: Self-pay

## 2020-11-25 MED FILL — Amlodipine Besylate Tab 10 MG (Base Equivalent): ORAL | 90 days supply | Qty: 90 | Fill #1 | Status: CN

## 2020-11-25 MED FILL — Losartan Potassium Tab 100 MG: ORAL | 90 days supply | Qty: 90 | Fill #1 | Status: CN

## 2020-11-26 ENCOUNTER — Other Ambulatory Visit (HOSPITAL_COMMUNITY): Payer: Self-pay

## 2020-11-26 MED FILL — Amlodipine Besylate Tab 10 MG (Base Equivalent): ORAL | 90 days supply | Qty: 90 | Fill #0 | Status: AC

## 2020-11-26 MED FILL — Losartan Potassium Tab 100 MG: ORAL | 90 days supply | Qty: 90 | Fill #0 | Status: AC

## 2020-12-12 ENCOUNTER — Other Ambulatory Visit (HOSPITAL_COMMUNITY): Payer: Self-pay

## 2020-12-18 ENCOUNTER — Other Ambulatory Visit (HOSPITAL_COMMUNITY): Payer: Self-pay

## 2020-12-18 MED ORDER — OZEMPIC (0.25 OR 0.5 MG/DOSE) 2 MG/1.5ML ~~LOC~~ SOPN
0.2500 mg | PEN_INJECTOR | SUBCUTANEOUS | 0 refills | Status: DC
  Filled 2020-12-18 – 2020-12-19 (×2): qty 1.5, 56d supply, fill #0

## 2020-12-19 ENCOUNTER — Other Ambulatory Visit (HOSPITAL_COMMUNITY): Payer: Self-pay

## 2021-01-20 ENCOUNTER — Other Ambulatory Visit (HOSPITAL_COMMUNITY): Payer: Self-pay

## 2021-01-20 MED ORDER — CITALOPRAM HYDROBROMIDE 20 MG PO TABS
20.0000 mg | ORAL_TABLET | Freq: Every day | ORAL | 3 refills | Status: DC
  Filled 2021-01-20: qty 30, 30d supply, fill #0
  Filled 2021-02-18: qty 30, 30d supply, fill #1
  Filled 2021-03-25: qty 30, 30d supply, fill #2
  Filled 2021-04-24: qty 30, 30d supply, fill #3

## 2021-01-27 ENCOUNTER — Other Ambulatory Visit (HOSPITAL_COMMUNITY): Payer: Self-pay

## 2021-01-27 MED ORDER — PREDNISONE 10 MG (21) PO TBPK
ORAL_TABLET | ORAL | 0 refills | Status: DC
  Filled 2021-01-27: qty 21, 6d supply, fill #0

## 2021-01-28 ENCOUNTER — Ambulatory Visit
Admission: RE | Admit: 2021-01-28 | Discharge: 2021-01-28 | Disposition: A | Discharge status: Home / Self Care | Payer: Medicare HMO | Source: Ambulatory Visit | Attending: Registered Nurse | Admitting: Registered Nurse

## 2021-01-28 ENCOUNTER — Ambulatory Visit
Admission: RE | Admit: 2021-01-28 | Discharge: 2021-01-28 | Disposition: A | Discharge status: Home / Self Care | Payer: Medicare HMO | Source: Ambulatory Visit | Attending: Family Medicine | Admitting: Family Medicine

## 2021-01-28 ENCOUNTER — Other Ambulatory Visit: Payer: Self-pay | Admitting: Family Medicine

## 2021-01-28 ENCOUNTER — Other Ambulatory Visit: Payer: Self-pay | Admitting: Registered Nurse

## 2021-01-28 DIAGNOSIS — M79674 Pain in right toe(s): Secondary | ICD-10-CM

## 2021-01-28 DIAGNOSIS — R0989 Other specified symptoms and signs involving the circulatory and respiratory systems: Secondary | ICD-10-CM

## 2021-01-28 DIAGNOSIS — R058 Other specified cough: Secondary | ICD-10-CM

## 2021-02-11 ENCOUNTER — Other Ambulatory Visit (HOSPITAL_COMMUNITY): Payer: Self-pay

## 2021-02-12 ENCOUNTER — Other Ambulatory Visit (HOSPITAL_COMMUNITY): Payer: Self-pay

## 2021-02-13 ENCOUNTER — Other Ambulatory Visit (HOSPITAL_COMMUNITY): Payer: Self-pay

## 2021-02-13 MED ORDER — OZEMPIC (0.25 OR 0.5 MG/DOSE) 2 MG/1.5ML ~~LOC~~ SOPN
0.2500 mg | PEN_INJECTOR | SUBCUTANEOUS | 0 refills | Status: DC
  Filled 2021-02-13: qty 1.5, 56d supply, fill #0

## 2021-02-13 MED ORDER — ACCU-CHEK SOFTCLIX LANCETS MISC
Freq: Every day | 3 refills | Status: DC
  Filled 2021-02-13: qty 100, 90d supply, fill #0

## 2021-02-13 MED ORDER — AMOXICILLIN-POT CLAVULANATE 500-125 MG PO TABS
1.0000 | ORAL_TABLET | Freq: Two times a day (BID) | ORAL | 0 refills | Status: AC
  Filled 2021-02-13: qty 14, 7d supply, fill #0

## 2021-02-14 ENCOUNTER — Other Ambulatory Visit (HOSPITAL_COMMUNITY): Payer: Self-pay

## 2021-02-17 ENCOUNTER — Other Ambulatory Visit (HOSPITAL_COMMUNITY): Payer: Self-pay

## 2021-02-18 ENCOUNTER — Other Ambulatory Visit (HOSPITAL_COMMUNITY): Payer: Self-pay

## 2021-02-20 ENCOUNTER — Other Ambulatory Visit (HOSPITAL_COMMUNITY): Payer: Self-pay

## 2021-02-20 MED ORDER — LOSARTAN POTASSIUM 100 MG PO TABS
100.0000 mg | ORAL_TABLET | Freq: Every day | ORAL | 3 refills | Status: DC
  Filled 2021-02-20: qty 90, 90d supply, fill #0
  Filled 2021-06-01: qty 90, 90d supply, fill #1
  Filled 2021-09-08: qty 90, 90d supply, fill #2
  Filled 2021-12-01: qty 90, 90d supply, fill #3

## 2021-02-20 MED ORDER — ACCU-CHEK AVIVA PLUS VI STRP
ORAL_STRIP | Freq: Two times a day (BID) | 1 refills | Status: AC
  Filled 2021-02-20: qty 100, 50d supply, fill #0
  Filled 2021-11-27: qty 100, 50d supply, fill #1

## 2021-02-21 ENCOUNTER — Other Ambulatory Visit (HOSPITAL_COMMUNITY): Payer: Self-pay

## 2021-03-11 ENCOUNTER — Other Ambulatory Visit (HOSPITAL_COMMUNITY): Payer: Self-pay

## 2021-03-11 MED ORDER — AMLODIPINE BESYLATE 10 MG PO TABS
10.0000 mg | ORAL_TABLET | Freq: Every day | ORAL | 3 refills | Status: DC
  Filled 2021-03-11: qty 90, 90d supply, fill #0
  Filled 2021-06-22: qty 90, 90d supply, fill #1
  Filled 2021-09-22: qty 90, 90d supply, fill #2
  Filled 2022-01-15: qty 90, 90d supply, fill #3

## 2021-03-25 ENCOUNTER — Other Ambulatory Visit (HOSPITAL_COMMUNITY): Payer: Self-pay

## 2021-04-10 ENCOUNTER — Other Ambulatory Visit (HOSPITAL_COMMUNITY): Payer: Self-pay

## 2021-04-10 MED ORDER — SEMAGLUTIDE(0.25 OR 0.5MG/DOS) 2 MG/1.5ML ~~LOC~~ SOPN
0.2500 mg | PEN_INJECTOR | SUBCUTANEOUS | 3 refills | Status: AC
  Filled 2021-04-10: qty 1.5, 56d supply, fill #0

## 2021-04-11 ENCOUNTER — Other Ambulatory Visit (HOSPITAL_COMMUNITY): Payer: Self-pay

## 2021-04-17 ENCOUNTER — Other Ambulatory Visit (HOSPITAL_COMMUNITY): Payer: Self-pay

## 2021-04-17 MED ORDER — OZEMPIC (0.25 OR 0.5 MG/DOSE) 2 MG/3ML ~~LOC~~ SOPN
0.2500 mg | PEN_INJECTOR | SUBCUTANEOUS | 3 refills | Status: AC
  Filled 2021-04-17: qty 1.5, 56d supply, fill #0
  Filled 2021-07-10: qty 3, 56d supply, fill #0

## 2021-04-24 ENCOUNTER — Other Ambulatory Visit (HOSPITAL_COMMUNITY): Payer: Self-pay

## 2021-05-09 ENCOUNTER — Other Ambulatory Visit (HOSPITAL_COMMUNITY): Payer: Self-pay

## 2021-05-10 ENCOUNTER — Other Ambulatory Visit (HOSPITAL_COMMUNITY): Payer: Self-pay

## 2021-05-12 ENCOUNTER — Other Ambulatory Visit (HOSPITAL_COMMUNITY): Payer: Self-pay

## 2021-05-12 MED ORDER — CITALOPRAM HYDROBROMIDE 20 MG PO TABS
20.0000 mg | ORAL_TABLET | Freq: Every day | ORAL | 3 refills | Status: DC
  Filled 2021-05-12 – 2021-05-19 (×2): qty 30, 30d supply, fill #0
  Filled 2021-06-22: qty 30, 30d supply, fill #1
  Filled 2021-07-23: qty 30, 30d supply, fill #2
  Filled 2021-08-21: qty 30, 30d supply, fill #3

## 2021-05-16 ENCOUNTER — Other Ambulatory Visit (HOSPITAL_COMMUNITY): Payer: Self-pay

## 2021-05-19 ENCOUNTER — Other Ambulatory Visit (HOSPITAL_COMMUNITY): Payer: Self-pay

## 2021-06-02 ENCOUNTER — Other Ambulatory Visit (HOSPITAL_COMMUNITY): Payer: Self-pay

## 2021-06-10 ENCOUNTER — Other Ambulatory Visit (HOSPITAL_COMMUNITY): Payer: Self-pay

## 2021-06-10 MED ORDER — ALBUTEROL SULFATE HFA 108 (90 BASE) MCG/ACT IN AERS
1.0000 | INHALATION_SPRAY | Freq: Four times a day (QID) | RESPIRATORY_TRACT | 11 refills | Status: DC
  Filled 2021-06-10: qty 18, 50d supply, fill #0
  Filled 2022-05-19: qty 6.7, 50d supply, fill #1

## 2021-06-19 ENCOUNTER — Other Ambulatory Visit (HOSPITAL_COMMUNITY): Payer: Self-pay

## 2021-06-23 ENCOUNTER — Other Ambulatory Visit (HOSPITAL_COMMUNITY): Payer: Self-pay

## 2021-07-10 ENCOUNTER — Other Ambulatory Visit (HOSPITAL_COMMUNITY): Payer: Self-pay

## 2021-07-23 ENCOUNTER — Other Ambulatory Visit (HOSPITAL_COMMUNITY): Payer: Self-pay

## 2021-08-07 ENCOUNTER — Other Ambulatory Visit (HOSPITAL_COMMUNITY): Payer: Self-pay

## 2021-08-07 ENCOUNTER — Other Ambulatory Visit (HOSPITAL_BASED_OUTPATIENT_CLINIC_OR_DEPARTMENT_OTHER): Payer: Self-pay

## 2021-08-07 MED ORDER — ACETAMINOPHEN-CODEINE 300-30 MG PO TABS
1.0000 | ORAL_TABLET | Freq: Four times a day (QID) | ORAL | 0 refills | Status: AC | PRN
  Filled 2021-08-07: qty 8, 2d supply, fill #0

## 2021-08-07 MED ORDER — IBUPROFEN 600 MG PO TABS
600.0000 mg | ORAL_TABLET | ORAL | 0 refills | Status: AC
  Filled 2021-08-07: qty 15, 4d supply, fill #0

## 2021-08-07 MED ORDER — AMOXICILLIN 500 MG PO CAPS
500.0000 mg | ORAL_CAPSULE | Freq: Three times a day (TID) | ORAL | 0 refills | Status: AC
  Filled 2021-08-07: qty 15, 5d supply, fill #0

## 2021-08-11 ENCOUNTER — Other Ambulatory Visit (HOSPITAL_COMMUNITY): Payer: Self-pay

## 2021-08-13 ENCOUNTER — Other Ambulatory Visit (HOSPITAL_COMMUNITY): Payer: Self-pay

## 2021-08-21 ENCOUNTER — Other Ambulatory Visit (HOSPITAL_COMMUNITY): Payer: Self-pay

## 2021-08-25 ENCOUNTER — Other Ambulatory Visit: Payer: Self-pay | Admitting: Family Medicine

## 2021-08-25 ENCOUNTER — Other Ambulatory Visit (HOSPITAL_COMMUNITY): Payer: Self-pay

## 2021-08-25 ENCOUNTER — Ambulatory Visit
Admission: RE | Admit: 2021-08-25 | Discharge: 2021-08-25 | Disposition: A | Discharge status: Home / Self Care | Payer: Medicare HMO | Source: Ambulatory Visit | Attending: Family Medicine | Admitting: Family Medicine

## 2021-08-25 DIAGNOSIS — M79641 Pain in right hand: Secondary | ICD-10-CM

## 2021-09-08 ENCOUNTER — Other Ambulatory Visit (HOSPITAL_COMMUNITY): Payer: Self-pay

## 2021-09-15 ENCOUNTER — Other Ambulatory Visit (HOSPITAL_COMMUNITY): Payer: Self-pay

## 2021-09-15 MED ORDER — OZEMPIC (0.25 OR 0.5 MG/DOSE) 2 MG/3ML ~~LOC~~ SOPN
0.2500 mg | PEN_INJECTOR | SUBCUTANEOUS | 3 refills | Status: DC
  Filled 2021-09-15: qty 3, 56d supply, fill #0
  Filled 2021-11-12: qty 3, 56d supply, fill #1
  Filled 2022-01-15 – 2022-03-17 (×2): qty 3, 56d supply, fill #2
  Filled 2022-05-12: qty 3, 56d supply, fill #3

## 2021-09-16 ENCOUNTER — Other Ambulatory Visit (HOSPITAL_COMMUNITY): Payer: Self-pay

## 2021-09-22 ENCOUNTER — Other Ambulatory Visit (HOSPITAL_COMMUNITY): Payer: Self-pay

## 2021-09-23 ENCOUNTER — Other Ambulatory Visit (HOSPITAL_COMMUNITY): Payer: Self-pay

## 2021-09-23 MED ORDER — CITALOPRAM HYDROBROMIDE 20 MG PO TABS
20.0000 mg | ORAL_TABLET | Freq: Every day | ORAL | 3 refills | Status: DC
  Filled 2021-09-23: qty 30, 30d supply, fill #0
  Filled 2021-10-21: qty 30, 30d supply, fill #1
  Filled 2021-11-23: qty 30, 30d supply, fill #2
  Filled 2022-01-15: qty 30, 30d supply, fill #3

## 2021-09-24 ENCOUNTER — Other Ambulatory Visit (HOSPITAL_COMMUNITY): Payer: Self-pay

## 2021-10-21 ENCOUNTER — Other Ambulatory Visit (HOSPITAL_COMMUNITY): Payer: Self-pay

## 2021-11-01 ENCOUNTER — Other Ambulatory Visit: Payer: Self-pay

## 2021-11-01 ENCOUNTER — Encounter (HOSPITAL_COMMUNITY): Payer: Self-pay | Admitting: *Deleted

## 2021-11-01 ENCOUNTER — Ambulatory Visit (HOSPITAL_COMMUNITY)
Admission: EM | Admit: 2021-11-01 | Discharge: 2021-11-01 | Disposition: A | Discharge status: Home / Self Care | Payer: Medicare HMO | Attending: Internal Medicine | Admitting: Internal Medicine

## 2021-11-01 ENCOUNTER — Ambulatory Visit (INDEPENDENT_AMBULATORY_CARE_PROVIDER_SITE_OTHER): Payer: Medicare HMO

## 2021-11-01 DIAGNOSIS — M25511 Pain in right shoulder: Secondary | ICD-10-CM | POA: Diagnosis not present

## 2021-11-01 DIAGNOSIS — W19XXXA Unspecified fall, initial encounter: Secondary | ICD-10-CM | POA: Diagnosis not present

## 2021-11-01 DIAGNOSIS — R079 Chest pain, unspecified: Secondary | ICD-10-CM

## 2021-11-01 DIAGNOSIS — R0781 Pleurodynia: Secondary | ICD-10-CM | POA: Diagnosis not present

## 2021-11-01 MED ORDER — TIZANIDINE HCL 2 MG PO CAPS: 2.0000 mg | ORAL_CAPSULE | Freq: Three times a day (TID) | ORAL | 0 refills | Status: AC

## 2021-11-01 MED ORDER — ACETAMINOPHEN 325 MG PO TABS
975.0000 mg | ORAL_TABLET | Freq: Once | ORAL | Status: AC
Start: 2021-11-01 — End: 2021-11-01
  Administered 2021-11-01: 975 mg via ORAL

## 2021-11-01 MED ORDER — ACETAMINOPHEN 325 MG PO TABS
ORAL_TABLET | ORAL | Status: AC
  Filled 2021-11-01: qty 3

## 2021-11-01 NOTE — ED Provider Notes (Signed)
[facility]    CSN: [id number] Arrival date & time: 11/01/21  1326      History   Chief Complaint Chief Complaint  Patient presents with  . Fall    HPI Kristi Bennett is a 71 y.o. female.   Patient presents urgent care for evaluation of right-sided rib cage pain that radiates to her right hip and right shoulder pain that happened after she fell at [employer] today.  Patient noticed that there was some liquid spilled on the floor due to an accident that was well marked and she was walking around the liquid and attempted to avoid falling when she stepped in an area that was wet and was not outlined by store staff.  When she stepped with her right foot onto the wet area, her right foot slipped out from under her behind her throat causing her to land on her right knee, right side, and right shoulder.  She denies syncope and did not hit her head during fall.  She does not take blood thinning medications.  No dizziness prior to falling.  She denies blurry vision, decreased visual acuity, headache, nausea, vomiting, abdominal pain, and weakness at this time.  She is a type 2 diabetic that is well controlled with medications without complications.    Fall   Past Medical History:  Diagnosis Date  . Allergic rhinitis   . Asthma   . Depression   . Diabetes mellitus without complication ([place])   . GERD (gastroesophageal reflux disease)   . Heart murmur   . Hyperlipidemia   . Hypertension   . Memory impairment   . Obesity   . Pneumothorax   . Sinusitis     Patient Active Problem List   Diagnosis Date Noted  . New-onset angina ([place]) 04/05/2014  . Chest tightness 07/24/2013  . Hypertension   . Hyperlipidemia   . Diabetes mellitus without complication ([place])   . GERD (gastroesophageal reflux disease)   . Obesity     Past Surgical History:  Procedure Laterality Date  . ABDOMINAL HYSTERECTOMY    . BREAST LUMPECTOMY    . HERNIA REPAIR    . LEFT HEART CATHETERIZATION  WITH CORONARY ANGIOGRAM N/A 04/05/2014   Procedure: LEFT HEART CATHETERIZATION WITH CORONARY ANGIOGRAM;  Surgeon: Clent Demark, MD;  Location: [facility];  Service: Cardiovascular;  Laterality: N/A;  . LEG SURGERY    . TUBAL LIGATION      OB History   No obstetric history on file.      Home Medications    Prior to Admission medications   Medication Sig Start Date End Date Taking? Authorizing Provider  Accu-Chek Softclix Lancets lancets Use to check blood sugar once daily 02/13/21     acetaminophen-codeine (TYLENOL #3) 300-30 MG tablet Take 1 tablet by mouth every 6 (six) hours as needed for pain 08/07/21     albuterol (PROVENTIL HFA;VENTOLIN HFA) 108 (90 BASE) MCG/ACT inhaler Inhale 2 puffs into the lungs every 6 (six) hours as needed for wheezing or shortness of breath.     [provider]  albuterol (VENTOLIN HFA) 108 (90 Base) MCG/ACT inhaler Inhale 1 puff into the lungs every 6 (six) hours. 05/24/20     albuterol (VENTOLIN HFA) 108 (90 Base) MCG/ACT inhaler Inhale 1 puff into the lungs every 6 (six) hours. 06/10/21     amLODipine (NORVASC) 10 MG tablet TAKE 1 TABLET BY MOUTH EVERY NIGHT AT BEDTIME 02/12/20 02/11/21  Charolette Forward, MD  amLODipine (NORVASC)  10 MG tablet TAKE ONE TABLET BY MOUTH EVERY NIGHT AT BEDTIME 11/07/19 11/06/20  Charolette Forward, MD  amLODipine (NORVASC) 10 MG tablet Take 1 tablet (10 mg total) by mouth at bedtime. 03/11/21     amLODipine (NORVASC) 5 MG tablet Take 1 tablet (5 mg total) by mouth daily. 04/06/14   Charolette Forward, MD  amoxicillin (AMOXIL) 500 MG capsule Take 1 capsule (500 mg total) by mouth 3 (three) times daily until gone 08/07/21     amoxicillin-clavulanate (AUGMENTIN) 500-125 MG tablet Take 1 tablet (500 mg total) by mouth every 12 (twelve) hours. 02/13/21     amoxicillin-clavulanate (AUGMENTIN) 875-125 MG tablet Take 1 tablet by mouth every 12 (twelve) hours for 10 days. 05/28/20     aspirin EC 81 MG tablet Take 81 mg by mouth daily at 2 PM  daily at 2 PM.    [provider]  buPROPion (WELLBUTRIN) 100 MG tablet TAKE 1 TABLET BY MOUTH DAILY FOR 30 DAYS 03/28/20 03/28/21  Cipriano Mile, NP  cetirizine (ZYRTEC) 10 MG tablet Take 10 mg by mouth daily.    [provider]  citalopram (CELEXA) 10 MG tablet Take 10 mg by mouth daily.    [provider]  citalopram (CELEXA) 20 MG tablet Take 1 tablet (20 mg total) by mouth daily. 09/23/21     glucose blood (ACCU-CHEK AVIVA PLUS) test strip USE AS DIRECTED TWICE DAILY 02/20/21   Charolette Forward, MD  ibuprofen (ADVIL) 600 MG tablet Take 1 tablet (600 mg total) by mouth every 6 to 8 hours as needed for pain 08/07/21     losartan (COZAAR) 100 MG tablet Take 100 mg by mouth daily at 2 PM daily at 2 PM.     [provider]  losartan (COZAAR) 100 MG tablet TAKE 1 TABLET BY MOUTH EVERY DAY 02/12/20 02/11/21  Charolette Forward, MD  losartan (COZAAR) 100 MG tablet Take 1 tablet (100 mg total) by mouth daily. 02/20/21     metFORMIN (GLUCOPHAGE) 1000 MG tablet Take 1,000 mg by mouth 2 (two) times daily with a meal.    [provider]  metFORMIN (GLUCOPHAGE-XR) 500 MG 24 hr tablet Take 500 mg by mouth daily. 07/13/19   [provider]  metFORMIN (GLUCOPHAGE-XR) 500 MG 24 hr tablet Take 1 tablet (500 mg total) by mouth once a day with evening meal Patient taking differently: Take 1,000 mg by mouth every evening. 10/11/20     montelukast (SINGULAIR) 10 MG tablet Take 10 mg by mouth at bedtime as needed (allergies).     [provider]  naproxen sodium (ALEVE) 220 MG tablet Take 440 mg by mouth daily as needed.    [provider]  nitroGLYCERIN (NITROSTAT) 0.4 MG SL tablet Place 1 tablet (0.4 mg total) under the tongue every 5 (five) minutes as needed for chest pain. 04/06/14   Charolette Forward, MD  polyethylene glycol-electrolytes (NULYTELY) 420 g solution Take as directed 04/24/20     prednisoLONE acetate (PRED FORTE) 1 % ophthalmic suspension Place 1 drop  into the left eye every day at bedtime 09/20/20     predniSONE (DELTASONE) 5 MG tablet Take 6 tablets by mouth on day 1 and decrease by 1 tablet per day for the next 5 days (6-5-4-3-2-1) 05/24/20     predniSONE (STERAPRED UNI-PAK 21 TAB) 10 MG (21) TBPK tablet Take as directed for  6 days 01/27/21     rosuvastatin (CRESTOR) 5 MG tablet Take 1 tablet (5 mg total) by mouth  daily at 6 PM. 04/06/14   Charolette Forward, MD  rosuvastatin (CRESTOR) 5 MG tablet TAKE ONE (1) TABLET BY MOUTH EVERY DAY AT 6:00PM 01/03/20 01/02/21  Charolette Forward, MD  rosuvastatin (CRESTOR) 5 MG tablet Take 1 tablet (5 mg total) by mouth daily at 6 PM. 11/06/20     Semaglutide,0.25 or 0.'5MG'$ /DOS, (OZEMPIC, 0.25 OR 0.5 MG/DOSE,) 2 MG/1.5ML SOPN Inject 0.25 mg into the skin once a week rotating injection sites: abdomen, thighs, or upper arm 04/10/21     Semaglutide,0.25 or 0.'5MG'$ /DOS, (OZEMPIC, 0.25 OR 0.5 MG/DOSE,) 2 MG/3ML SOPN Inject 0.25 mg into the skin once a week on the same day rotating injection sites, abdomen, thighs, upper arm . 04/17/21     Semaglutide,0.25 or 0.'5MG'$ /DOS, (OZEMPIC, 0.25 OR 0.5 MG/DOSE,) 2 MG/3ML SOPN Inject 0.25 mg into the skin once a week on the same day rotating injection sites - abdomen, thighs, upper arm. 09/15/21       Family History Family History  Problem Relation Age of Onset  . Hypertension Other   . Coronary artery disease Other     Social History Social History   Tobacco Use  . Smoking status: Never  . Smokeless tobacco: Never  Substance Use Topics  . Alcohol use: Yes    Comment: glass of wine once or twice a month  . Drug use: No     Allergies   Avapro [irbesartan], Avelox [moxifloxacin], Excedrin extra strength [aspirin-acetaminophen-caffeine], and Lipitor [atorvastatin]   Review of Systems Review of Systems Per HPI  Physical Exam Triage Vital Signs ED Triage Vitals  Enc Vitals Group     BP 11/01/21 1405 139/79     Pulse Rate 11/01/21 1405 60     Resp 11/01/21 1405 18      Temp 11/01/21 1405 99 F (37.2 C)     Temp src --      SpO2 11/01/21 1405 97 %     Weight --      Height --      Head Circumference --      Peak Flow --      Pain Score 11/01/21 1401 6     Pain Loc --      Pain Edu? --      Excl. in [place]? --    No data found.  Updated Vital Signs BP 139/79   Pulse 60   Temp 99 F (37.2 C)   Resp 18   SpO2 97%   Visual Acuity Right Eye Distance:   Left Eye Distance:   Bilateral Distance:    Right Eye Near:   Left Eye Near:    Bilateral Near:     Physical Exam   UC Treatments / Results  Labs (all labs ordered are listed, but only abnormal results are displayed) Labs Reviewed - No data to display  EKG   Radiology No results found.  Procedures Procedures (including critical care time)  Medications Ordered in UC Medications  acetaminophen (TYLENOL) tablet 975 mg (has no administration in time range)    Initial Impression / Assessment and Plan / UC Course  I have reviewed the triage vital signs and the nursing notes.  Pertinent labs & imaging results that were available during my care of the patient were reviewed by me and considered in my medical decision making (see chart for details).     *** Final Clinical Impressions(s) / UC Diagnoses   Final diagnoses:  None   Discharge Instructions   None  ED Prescriptions   None    PDMP not reviewed this encounter.

## 2021-11-01 NOTE — Discharge Instructions (Addendum)
Your chest x-ray was negative for acute bony abnormality.   You may continue taking Tylenol Extra Strength every 6 hours as needed at home for pain.   You may take Zanaflex muscle relaxer 2 mg every 8 hours as needed for muscle spasm.  Do not take this medicine and drink alcohol or drive as it can make you sleepy.   You may use ice to your areas of greatest tenderness today and tomorrow, then switch to heat after 48 hours and perform gentle range of motion exercises to the areas of greatest tenderness.   If you develop any new or worsening symptoms or do not improve in the next 2 to 3 days, please return.  If your symptoms are severe, please go to the emergency room.  Follow-up with your primary care provider for further evaluation and management of your symptoms as well as ongoing wellness visits.  I hope you feel better!

## 2021-11-01 NOTE — ED Triage Notes (Signed)
Pt fell in store today after stepping on wet floor. Pt fell on the RT side . Pt reports back pain ,RT shoulder pain.

## 2021-11-12 ENCOUNTER — Other Ambulatory Visit (HOSPITAL_COMMUNITY): Payer: Self-pay

## 2021-11-12 MED ORDER — METFORMIN HCL ER 500 MG PO TB24
500.0000 mg | ORAL_TABLET | Freq: Every evening | ORAL | 3 refills | Status: DC
  Filled 2021-11-12: qty 90, 90d supply, fill #0
  Filled 2022-02-11: qty 90, 90d supply, fill #1
  Filled 2022-05-17: qty 90, 90d supply, fill #2
  Filled 2022-08-20: qty 90, 90d supply, fill #3

## 2021-11-17 ENCOUNTER — Other Ambulatory Visit (HOSPITAL_COMMUNITY): Payer: Self-pay

## 2021-11-17 MED ORDER — ROSUVASTATIN CALCIUM 5 MG PO TABS
5.0000 mg | ORAL_TABLET | Freq: Every day | ORAL | 3 refills | Status: DC
  Filled 2021-11-17: qty 90, 90d supply, fill #0
  Filled 2022-02-11: qty 90, 90d supply, fill #1
  Filled 2022-05-12: qty 90, 90d supply, fill #2
  Filled 2022-08-20: qty 90, 90d supply, fill #3

## 2021-11-24 ENCOUNTER — Other Ambulatory Visit (HOSPITAL_COMMUNITY): Payer: Self-pay

## 2021-11-27 ENCOUNTER — Other Ambulatory Visit (HOSPITAL_COMMUNITY): Payer: Self-pay

## 2021-12-01 ENCOUNTER — Other Ambulatory Visit (HOSPITAL_COMMUNITY): Payer: Self-pay

## 2022-01-15 ENCOUNTER — Other Ambulatory Visit (HOSPITAL_COMMUNITY): Payer: Self-pay

## 2022-02-11 ENCOUNTER — Other Ambulatory Visit (HOSPITAL_COMMUNITY): Payer: Self-pay

## 2022-02-11 MED ORDER — LOSARTAN POTASSIUM 100 MG PO TABS
100.0000 mg | ORAL_TABLET | Freq: Every day | ORAL | 3 refills | Status: DC
  Filled 2022-02-11 – 2022-03-10 (×2): qty 90, 90d supply, fill #0
  Filled 2022-06-14: qty 90, 90d supply, fill #1
  Filled 2022-09-28: qty 90, 90d supply, fill #2
  Filled 2022-12-28: qty 90, 90d supply, fill #3

## 2022-02-11 MED ORDER — CITALOPRAM HYDROBROMIDE 20 MG PO TABS
20.0000 mg | ORAL_TABLET | Freq: Every day | ORAL | 3 refills | Status: DC
  Filled 2022-02-11: qty 30, 30d supply, fill #0
  Filled 2022-03-17: qty 30, 30d supply, fill #1
  Filled 2022-04-14: qty 30, 30d supply, fill #2
  Filled 2022-05-17: qty 30, 30d supply, fill #3

## 2022-03-10 ENCOUNTER — Other Ambulatory Visit (HOSPITAL_COMMUNITY): Payer: Self-pay

## 2022-03-17 ENCOUNTER — Other Ambulatory Visit (HOSPITAL_COMMUNITY): Payer: Self-pay

## 2022-04-09 ENCOUNTER — Other Ambulatory Visit (HOSPITAL_COMMUNITY): Payer: Self-pay

## 2022-04-09 MED ORDER — AMLODIPINE BESYLATE 10 MG PO TABS
10.0000 mg | ORAL_TABLET | Freq: Every day | ORAL | 3 refills | Status: DC
  Filled 2022-04-09: qty 90, 90d supply, fill #0
  Filled 2022-07-13: qty 90, 90d supply, fill #1
  Filled 2022-10-27: qty 90, 90d supply, fill #2
  Filled 2023-01-22: qty 90, 90d supply, fill #3

## 2022-04-14 ENCOUNTER — Other Ambulatory Visit: Payer: Self-pay

## 2022-04-14 ENCOUNTER — Other Ambulatory Visit (HOSPITAL_COMMUNITY): Payer: Self-pay

## 2022-05-12 ENCOUNTER — Other Ambulatory Visit (HOSPITAL_COMMUNITY): Payer: Self-pay

## 2022-05-19 ENCOUNTER — Other Ambulatory Visit (HOSPITAL_COMMUNITY): Payer: Self-pay

## 2022-05-21 ENCOUNTER — Other Ambulatory Visit (HOSPITAL_COMMUNITY): Payer: Self-pay

## 2022-05-24 ENCOUNTER — Encounter (HOSPITAL_COMMUNITY): Payer: Self-pay | Admitting: Emergency Medicine

## 2022-05-24 ENCOUNTER — Emergency Department (HOSPITAL_COMMUNITY): Payer: Medicare HMO

## 2022-05-24 ENCOUNTER — Other Ambulatory Visit: Payer: Self-pay

## 2022-05-24 ENCOUNTER — Emergency Department (HOSPITAL_COMMUNITY)
Admission: EM | Admit: 2022-05-24 | Discharge: 2022-05-24 | Disposition: A | Discharge status: Home / Self Care | Payer: Medicare HMO | Attending: Emergency Medicine | Admitting: Emergency Medicine

## 2022-05-24 DIAGNOSIS — K529 Noninfective gastroenteritis and colitis, unspecified: Secondary | ICD-10-CM | POA: Diagnosis not present

## 2022-05-24 DIAGNOSIS — E860 Dehydration: Secondary | ICD-10-CM | POA: Insufficient documentation

## 2022-05-24 DIAGNOSIS — M791 Myalgia, unspecified site: Secondary | ICD-10-CM | POA: Diagnosis not present

## 2022-05-24 DIAGNOSIS — E876 Hypokalemia: Secondary | ICD-10-CM | POA: Insufficient documentation

## 2022-05-24 DIAGNOSIS — Z794 Long term (current) use of insulin: Secondary | ICD-10-CM | POA: Diagnosis not present

## 2022-05-24 DIAGNOSIS — Z7982 Long term (current) use of aspirin: Secondary | ICD-10-CM | POA: Insufficient documentation

## 2022-05-24 DIAGNOSIS — R197 Diarrhea, unspecified: Secondary | ICD-10-CM | POA: Diagnosis present

## 2022-05-24 DIAGNOSIS — Z79899 Other long term (current) drug therapy: Secondary | ICD-10-CM | POA: Diagnosis not present

## 2022-05-24 DIAGNOSIS — I10 Essential (primary) hypertension: Secondary | ICD-10-CM | POA: Insufficient documentation

## 2022-05-24 DIAGNOSIS — A09 Infectious gastroenteritis and colitis, unspecified: Secondary | ICD-10-CM

## 2022-05-24 LAB — COMPREHENSIVE METABOLIC PANEL
ALT: 16 U/L (ref 0–44)
AST: 24 U/L (ref 15–41)
Albumin: 3.9 g/dL (ref 3.5–5.0)
Alkaline Phosphatase: 85 U/L (ref 38–126)
Anion gap: 11 (ref 5–15)
BUN: 11 mg/dL (ref 8–23)
CO2: 22 mmol/L (ref 22–32)
Calcium: 8.4 mg/dL — ABNORMAL LOW (ref 8.9–10.3)
Chloride: 103 mmol/L (ref 98–111)
Creatinine, Ser: 0.65 mg/dL (ref 0.44–1.00)
GFR, Estimated: >60 mL/min (ref >60)
Glucose, Bld: 132 mg/dL — ABNORMAL HIGH (ref 70–99)
Potassium: 3 mmol/L — ABNORMAL LOW (ref 3.5–5.1)
Sodium: 136 mmol/L (ref 135–145)
Total Bilirubin: 0.5 mg/dL (ref 0.3–1.2)
Total Protein: 7.5 g/dL (ref 6.5–8.1)

## 2022-05-24 LAB — CBC WITH DIFFERENTIAL/PLATELET
Abs Immature Granulocytes: 0.02 10*3/uL (ref 0.00–0.07)
Basophils Absolute: 0 10*3/uL (ref 0.0–0.1)
Basophils Relative: 0 %
Eosinophils Absolute: 0 10*3/uL (ref 0.0–0.5)
Eosinophils Relative: 0 %
HCT: 40.1 % (ref 36.0–46.0)
Hemoglobin: 12.6 g/dL (ref 12.0–15.0)
Immature Granulocytes: 0 %
Lymphocytes Relative: 25 %
Lymphs Abs: 1.8 10*3/uL (ref 0.7–4.0)
MCH: 27.6 pg (ref 26.0–34.0)
MCHC: 31.4 g/dL (ref 30.0–36.0)
MCV: 87.9 fL (ref 80.0–100.0)
Monocytes Absolute: 0.7 10*3/uL (ref 0.1–1.0)
Monocytes Relative: 10 %
Neutro Abs: 4.7 10*3/uL (ref 1.7–7.7)
Neutrophils Relative %: 65 %
Platelets: 250 10*3/uL (ref 150–400)
RBC: 4.56 MIL/uL (ref 3.87–5.11)
RDW: 14.2 % (ref 11.5–15.5)
WBC: 7.2 10*3/uL (ref 4.0–10.5)
nRBC: 0 % (ref 0.0–0.2)

## 2022-05-24 LAB — URIC ACID: Uric Acid, Serum: 6.4 mg/dL (ref 2.5–7.1)

## 2022-05-24 MED ORDER — TRAMADOL HCL 50 MG PO TABS: 50.0000 mg | ORAL_TABLET | Freq: Four times a day (QID) | ORAL | 0 refills | Status: AC | PRN

## 2022-05-24 MED ORDER — KETOROLAC TROMETHAMINE 30 MG/ML IJ SOLN
15.0000 mg | Freq: Once | INTRAMUSCULAR | Status: AC
  Administered 2022-05-24: 15 mg via INTRAVENOUS
  Filled 2022-05-24: qty 1

## 2022-05-24 MED ORDER — POTASSIUM CHLORIDE CRYS ER 20 MEQ PO TBCR
40.0000 meq | EXTENDED_RELEASE_TABLET | Freq: Once | ORAL | Status: AC
  Administered 2022-05-24: 40 meq via ORAL
  Filled 2022-05-24: qty 2

## 2022-05-24 MED ORDER — HYDROMORPHONE HCL 1 MG/ML IJ SOLN: 1.0000 mg | Freq: Once | INTRAMUSCULAR | Status: DC

## 2022-05-24 MED ORDER — SODIUM CHLORIDE 0.9 % IV BOLUS
1000.0000 mL | Freq: Once | INTRAVENOUS | Status: AC
  Administered 2022-05-24: 1000 mL via INTRAVENOUS

## 2022-05-24 MED ORDER — HYDROMORPHONE HCL 1 MG/ML IJ SOLN
0.5000 mg | Freq: Once | INTRAMUSCULAR | Status: AC
  Administered 2022-05-24: 0.5 mg via INTRAVENOUS
  Filled 2022-05-24: qty 1

## 2022-05-24 MED ORDER — POTASSIUM CHLORIDE CRYS ER 20 MEQ PO TBCR: 20.0000 meq | EXTENDED_RELEASE_TABLET | Freq: Every day | ORAL | 0 refills | Status: AC

## 2022-05-24 NOTE — Discharge Instructions (Addendum)
Drink plenty of fluids.  Follow-up with your family doctor later this week for recheck. 

## 2022-05-24 NOTE — ED Triage Notes (Signed)
Pt BIB EMS from home, c/o generalized body pain, particularly joint pain. Also c/o diarrhea for past 2 days.   BP 140/90 P 84 spO2 98% T 97.7 CBG 162

## 2022-05-24 NOTE — ED Provider Notes (Signed)
[place] EMERGENCY DEPARTMENT AT [facility] Provider Note   CSN: [id number] Arrival date & time: 05/24/22  2774     History {Add pertinent medical, surgical, social history, OB history to HPI:1} Chief Complaint  Patient presents with   Generalized Body Aches   Diarrhea    Kristi Bennett is a 72 y.o. female.  Patient complains of diarrhea and aching in her right thigh and ankles.   Diarrhea      Home Medications Prior to Admission medications   Medication Sig Start Date End Date Taking? Authorizing Provider  potassium chloride SA (KLOR-CON M) 20 MEQ tablet Take 1 tablet (20 mEq total) by mouth daily. 05/24/22  Yes Bethann Berkshire, MD  traMADol (ULTRAM) 50 MG tablet Take 1 tablet (50 mg total) by mouth every 6 (six) hours as needed. 05/24/22  Yes Bethann Berkshire, MD  Accu-Chek Softclix Lancets lancets Use to check blood sugar once daily 02/13/21     acetaminophen-codeine (TYLENOL #3) 300-30 MG tablet Take 1 tablet by mouth every 6 (six) hours as needed for pain 08/07/21     albuterol (PROVENTIL HFA;VENTOLIN HFA) 108 (90 BASE) MCG/ACT inhaler Inhale 2 puffs into the lungs every 6 (six) hours as needed for wheezing or shortness of breath.     [provider]  albuterol (VENTOLIN HFA) 108 (90 Base) MCG/ACT inhaler Inhale 1 puff into the lungs every 6 (six) hours. 05/24/20     albuterol (VENTOLIN HFA) 108 (90 Base) MCG/ACT inhaler Inhale 1 puff into the lungs every 6 (six) hours. 06/10/21     amLODipine (NORVASC) 10 MG tablet TAKE 1 TABLET BY MOUTH EVERY NIGHT AT BEDTIME 02/12/20 02/11/21  Rinaldo Cloud, MD  amLODipine (NORVASC) 10 MG tablet TAKE ONE TABLET BY MOUTH EVERY NIGHT AT BEDTIME 11/07/19 11/06/20  Rinaldo Cloud, MD  amLODipine (NORVASC) 10 MG tablet Take 1 tablet (10 mg total) by mouth at bedtime. 04/09/22     amLODipine (NORVASC) 5 MG tablet Take 1 tablet (5 mg total) by mouth daily. 04/06/14   Rinaldo Cloud, MD  amoxicillin (AMOXIL) 500 MG capsule Take 1  capsule (500 mg total) by mouth 3 (three) times daily until gone 08/07/21     amoxicillin-clavulanate (AUGMENTIN) 500-125 MG tablet Take 1 tablet (500 mg total) by mouth every 12 (twelve) hours. 02/13/21     amoxicillin-clavulanate (AUGMENTIN) 875-125 MG tablet Take 1 tablet by mouth every 12 (twelve) hours for 10 days. 05/28/20     aspirin EC 81 MG tablet Take 81 mg by mouth daily at 2 PM daily at 2 PM.    [provider]  buPROPion (WELLBUTRIN) 100 MG tablet TAKE 1 TABLET BY MOUTH DAILY FOR 30 DAYS 03/28/20 03/28/21  Hillery Aldo, NP  cetirizine (ZYRTEC) 10 MG tablet Take 10 mg by mouth daily.    [provider]  citalopram (CELEXA) 10 MG tablet Take 10 mg by mouth daily.    [provider]  citalopram (CELEXA) 20 MG tablet Take 1 tablet (20 mg total) by mouth daily. 02/11/22     glucose blood (ACCU-CHEK AVIVA PLUS) test strip USE AS DIRECTED TWICE DAILY 02/20/21   Rinaldo Cloud, MD  ibuprofen (ADVIL) 600 MG tablet Take 1 tablet (600 mg total) by mouth every 6 to 8 hours as needed for pain 08/07/21     losartan (COZAAR) 100 MG tablet Take 100 mg by mouth daily at 2 PM daily at 2 PM.     [provider]  losartan (COZAAR) 100 MG  tablet TAKE 1 TABLET BY MOUTH EVERY DAY 02/12/20 02/11/21  Rinaldo Cloud, MD  losartan (COZAAR) 100 MG tablet Take 1 tablet (100 mg total) by mouth daily. 02/11/22     metFORMIN (GLUCOPHAGE) 1000 MG tablet Take 1,000 mg by mouth 2 (two) times daily with a meal.    [provider]  metFORMIN (GLUCOPHAGE-XR) 500 MG 24 hr tablet Take 500 mg by mouth daily. 07/13/19   [provider]  metFORMIN (GLUCOPHAGE-XR) 500 MG 24 hr tablet Take 1 tablet (500 mg total) by mouth once a day with evening meal 11/12/21     montelukast (SINGULAIR) 10 MG tablet Take 10 mg by mouth at bedtime as needed (allergies).     [provider]  naproxen sodium (ALEVE) 220 MG tablet Take 440 mg by mouth daily as needed.    [provider]   nitroGLYCERIN (NITROSTAT) 0.4 MG SL tablet Place 1 tablet (0.4 mg total) under the tongue every 5 (five) minutes as needed for chest pain. 04/06/14   Rinaldo Cloud, MD  polyethylene glycol-electrolytes (NULYTELY) 420 g solution Take as directed 04/24/20     prednisoLONE acetate (PRED FORTE) 1 % ophthalmic suspension Place 1 drop into the left eye every day at bedtime 09/20/20     predniSONE (DELTASONE) 5 MG tablet Take 6 tablets by mouth on day 1 and decrease by 1 tablet per day for the next 5 days (6-5-4-3-2-1) 05/24/20     predniSONE (STERAPRED UNI-PAK 21 TAB) 10 MG (21) TBPK tablet Take as directed for  6 days 01/27/21     rosuvastatin (CRESTOR) 5 MG tablet Take 1 tablet (5 mg total) by mouth daily at 6 PM. 04/06/14   Rinaldo Cloud, MD  rosuvastatin (CRESTOR) 5 MG tablet TAKE ONE (1) TABLET BY MOUTH EVERY DAY AT 6:00PM 01/03/20 01/02/21  Rinaldo Cloud, MD  rosuvastatin (CRESTOR) 5 MG tablet Take 1 tablet (5 mg total) by mouth daily at 6 PM. 11/17/21     Semaglutide,0.25 or 0.5MG /DOS, (OZEMPIC, 0.25 OR 0.5 MG/DOSE,) 2 MG/1.5ML SOPN Inject 0.25 mg into the skin once a week rotating injection sites: abdomen, thighs, or upper arm 04/10/21     Semaglutide,0.25 or 0.5MG /DOS, (OZEMPIC, 0.25 OR 0.5 MG/DOSE,) 2 MG/3ML SOPN Inject 0.25 mg into the skin once a week on the same day rotating injection sites, abdomen, thighs, upper arm . 04/17/21     Semaglutide,0.25 or 0.5MG /DOS, (OZEMPIC, 0.25 OR 0.5 MG/DOSE,) 2 MG/3ML SOPN Inject 0.25 mg into the skin once a week on the same day rotating injection sites - abdomen, thighs, upper arm. 09/15/21     tizanidine (ZANAFLEX) 2 MG capsule Take 1 capsule (2 mg total) by mouth 3 (three) times daily. 11/01/21   Carlisle Beers, FNP      Allergies    Avapro [irbesartan], Avelox [moxifloxacin], Excedrin extra strength [aspirin-acetaminophen-caffeine], and Lipitor [atorvastatin]    Review of Systems   Review of Systems  Gastrointestinal:  Positive for diarrhea.     Physical Exam Updated Vital Signs BP (!) 140/52   Pulse (!) 54   Temp 97.8 F (36.6 C) (Oral)   Resp 18   SpO2 99%  Physical Exam  ED Results / Procedures / Treatments   Labs (all labs ordered are listed, but only abnormal results are displayed) Labs Reviewed  COMPREHENSIVE METABOLIC PANEL - Abnormal; Notable for the following components:      Result Value   Potassium 3.0 (*)    Glucose, Bld 132 (*)    Calcium  8.4 (*)    All other components within normal limits  CBC WITH DIFFERENTIAL/PLATELET  URIC ACID    EKG None  Radiology DG Foot Complete Right  Result Date: 05/24/2022 CLINICAL DATA:  72 year old female with history of generalized body pain. EXAM: RIGHT FOOT COMPLETE - 3+ VIEW COMPARISON:  No priors. FINDINGS: Three views of the right foot demonstrate no acute displaced fracture or dislocation. Hallux valgus metatarsus primus varus. Multifocal joint space narrowing, subchondral sclerosis, subchondral cyst formation and osteophyte formation, most severe throughout the midfoot, indicative of osteoarthritis. Plantar and dorsal calcaneal enthesophytes. IMPRESSION: 1. No acute radiographic abnormality of the right foot. 2. 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 3. Bunion deformity. 4. Large calcaneal enthesophytes, as above. Electronically Signed   By: Trudie Reed M.D.   On: 05/24/2022 09:41   DG Ankle Complete Right  Result Date: 05/24/2022 CLINICAL DATA:  72 year old female with history of generalized body pain. EXAM: RIGHT ANKLE - COMPLETE 3+ VIEW COMPARISON:  No priors. FINDINGS: Three views of the right ankle demonstrate no acute displaced fracture, subluxation or dislocation. There is multifocal joint space narrowing, subchondral sclerosis, subchondral cyst formation and osteophyte formation, most severe in the midfoot, indicative of advanced osteoarthritis. Plantar calcaneal and dorsal calcaneal spurs are incidentally noted. IMPRESSION:  1. No acute radiographic abnormality of the right ankle. 2. Degenerative changes of osteoarthritis. 3. Calcaneal enthesophytes, as above. Electronically Signed   By: Trudie Reed M.D.   On: 05/24/2022 09:40   DG Hip Unilat W or Wo Pelvis 2-3 Views Right  Result Date: 05/24/2022 CLINICAL DATA:  72 year old female with history of generalized body pain. Diarrhea. EXAM: DG HIP (WITH OR WITHOUT PELVIS) 2-3V RIGHT COMPARISON:  No priors. FINDINGS: There is no evidence of hip fracture or dislocation. There is no evidence of arthropathy or other focal bone abnormality. IMPRESSION: Negative. Electronically Signed   By: Trudie Reed M.D.   On: 05/24/2022 09:39   DG ABD ACUTE 2+V W 1V CHEST  Result Date: 05/24/2022 CLINICAL DATA:  72 year old female with history of generalized body pain. Diarrhea. EXAM: DG ABDOMEN ACUTE WITH 1 VIEW CHEST COMPARISON:  Chest x-ray 11/01/2021. FINDINGS: Lung volumes are normal. No consolidative airspace disease. No pleural effusions. No pneumothorax. No pulmonary nodule or mass noted. Pulmonary vasculature and the cardiomediastinal silhouette are within normal limits. Atherosclerotic calcifications in the thoracic aorta. IMPRESSION: 1.  No radiographic evidence of acute cardiopulmonary disease. 2. Aortic atherosclerosis. Electronically Signed   By: Trudie Reed M.D.   On: 05/24/2022 09:38    Procedures Procedures  {Document cardiac monitor, telemetry assessment procedure when appropriate:1}  Medications Ordered in ED Medications  potassium chloride SA (KLOR-CON M) CR tablet 40 mEq (has no administration in time range)  sodium chloride 0.9 % bolus 1,000 mL (1,000 mLs Intravenous New Bag/Given 05/24/22 1020)  ketorolac (TORADOL) 30 MG/ML injection 15 mg (15 mg Intravenous Given 05/24/22 1020)    ED Course/ Medical Decision Making/ A&P   {   Click here for ABCD2, HEART and other calculatorsREFRESH Note before signing :1}                          Medical Decision  Making Amount and/or Complexity of Data Reviewed Labs: ordered. Radiology: ordered.  Risk Prescription drug management.   Patient with gastroenteritis and dehydration and hypokalemia.  She improved with IV fluids.  Patient sent home with potassium and Ultram for the discomfort and will follow-up with  PCP  {Document critical care time when appropriate:1} {Document review of labs and clinical decision tools ie heart score, Chads2Vasc2 etc:1}  {Document your independent review of radiology images, and any outside records:1} {Document your discussion with family members, caretakers, and with consultants:1} {Document social determinants of health affecting pt's care:1} {Document your decision making why or why not admission, treatments were needed:1} Final Clinical Impression(s) / ED Diagnoses Final diagnoses:  Diarrhea of infectious origin  Hypokalemia    Rx / DC Orders ED Discharge Orders          Ordered    traMADol (ULTRAM) 50 MG tablet  Every 6 hours PRN        05/24/22 1127    potassium chloride SA (KLOR-CON M) 20 MEQ tablet  Daily        05/24/22 1127

## 2022-05-27 ENCOUNTER — Other Ambulatory Visit: Payer: Self-pay

## 2022-05-27 ENCOUNTER — Other Ambulatory Visit (HOSPITAL_COMMUNITY): Payer: Self-pay

## 2022-05-27 MED ORDER — POTASSIUM CHLORIDE ER 20 MEQ PO TBCR
20.0000 meq | EXTENDED_RELEASE_TABLET | Freq: Every morning | ORAL | 3 refills | Status: DC
  Filled 2022-05-27: qty 90, 90d supply, fill #0
  Filled 2022-08-20: qty 90, 90d supply, fill #1
  Filled 2022-11-18: qty 90, 90d supply, fill #2
  Filled 2023-03-01: qty 90, 90d supply, fill #3

## 2022-05-27 MED ORDER — OMEPRAZOLE 20 MG PO CPDR
20.0000 mg | DELAYED_RELEASE_CAPSULE | Freq: Every day | ORAL | 3 refills | Status: DC
  Filled 2022-05-27: qty 90, 90d supply, fill #0
  Filled 2022-08-20: qty 90, 90d supply, fill #1
  Filled 2022-12-04: qty 90, 90d supply, fill #2

## 2022-05-29 ENCOUNTER — Other Ambulatory Visit (HOSPITAL_COMMUNITY): Payer: Self-pay

## 2022-05-29 MED ORDER — VITAMIN D3 25 MCG (1000 UNIT) PO TABS: 1000.0000 [IU] | ORAL_TABLET | Freq: Every day | ORAL | 3 refills | Status: AC

## 2022-06-10 ENCOUNTER — Other Ambulatory Visit: Payer: Self-pay

## 2022-06-10 ENCOUNTER — Other Ambulatory Visit (HOSPITAL_COMMUNITY): Payer: Self-pay

## 2022-06-10 MED ORDER — OZEMPIC (0.25 OR 0.5 MG/DOSE) 2 MG/3ML ~~LOC~~ SOPN
0.2500 mg | PEN_INJECTOR | SUBCUTANEOUS | 3 refills | Status: DC
  Filled 2022-06-10 – 2022-09-28 (×2): qty 3, 56d supply, fill #0
  Filled 2022-12-04: qty 3, 56d supply, fill #1
  Filled 2023-01-22: qty 3, 56d supply, fill #2

## 2022-06-14 ENCOUNTER — Other Ambulatory Visit (HOSPITAL_COMMUNITY): Payer: Self-pay

## 2022-06-15 ENCOUNTER — Other Ambulatory Visit (HOSPITAL_COMMUNITY): Payer: Self-pay

## 2022-06-15 MED ORDER — CITALOPRAM HYDROBROMIDE 20 MG PO TABS
20.0000 mg | ORAL_TABLET | Freq: Every day | ORAL | 3 refills | Status: DC
  Filled 2022-06-15: qty 30, 30d supply, fill #0
  Filled 2022-07-13: qty 30, 30d supply, fill #1
  Filled 2022-08-20: qty 30, 30d supply, fill #2
  Filled 2022-09-18: qty 30, 30d supply, fill #3

## 2022-06-16 ENCOUNTER — Other Ambulatory Visit (HOSPITAL_COMMUNITY): Payer: Self-pay

## 2022-06-26 ENCOUNTER — Other Ambulatory Visit: Payer: Self-pay | Admitting: Family Medicine

## 2022-06-26 DIAGNOSIS — D329 Benign neoplasm of meninges, unspecified: Secondary | ICD-10-CM

## 2022-07-02 ENCOUNTER — Encounter: Payer: Self-pay | Admitting: Family Medicine

## 2022-07-10 ENCOUNTER — Ambulatory Visit
Admission: RE | Admit: 2022-07-10 | Discharge: 2022-07-10 | Disposition: A | Discharge status: Home / Self Care | Payer: Medicare HMO | Source: Ambulatory Visit | Attending: Family Medicine | Admitting: Family Medicine

## 2022-07-10 DIAGNOSIS — D329 Benign neoplasm of meninges, unspecified: Secondary | ICD-10-CM

## 2022-08-25 ENCOUNTER — Other Ambulatory Visit (HOSPITAL_COMMUNITY): Payer: Self-pay

## 2022-08-27 ENCOUNTER — Other Ambulatory Visit (HOSPITAL_COMMUNITY): Payer: Self-pay | Admitting: Cardiology

## 2022-08-27 DIAGNOSIS — R079 Chest pain, unspecified: Secondary | ICD-10-CM

## 2022-09-02 ENCOUNTER — Other Ambulatory Visit (HOSPITAL_COMMUNITY): Payer: Self-pay | Admitting: Family Medicine

## 2022-09-02 ENCOUNTER — Other Ambulatory Visit (HOSPITAL_COMMUNITY): Payer: Self-pay

## 2022-09-03 ENCOUNTER — Other Ambulatory Visit (HOSPITAL_COMMUNITY): Payer: Self-pay

## 2022-09-07 ENCOUNTER — Encounter (HOSPITAL_COMMUNITY)
Admission: RE | Admit: 2022-09-07 | Discharge: 2022-09-07 | Disposition: A | Discharge status: Home / Self Care | Payer: Medicare HMO | Source: Ambulatory Visit | Attending: Cardiology | Admitting: Cardiology

## 2022-09-07 DIAGNOSIS — R079 Chest pain, unspecified: Secondary | ICD-10-CM | POA: Insufficient documentation

## 2022-09-07 MED ORDER — TECHNETIUM TC 99M TETROFOSMIN IV KIT
11.0000 | PACK | Freq: Once | INTRAVENOUS | Status: AC | PRN
  Administered 2022-09-07: 11 via INTRAVENOUS

## 2022-09-07 MED ORDER — ONDANSETRON HCL 4 MG/2ML IJ SOLN
INTRAMUSCULAR | Status: AC
  Filled 2022-09-07: qty 2

## 2022-09-07 MED ORDER — REGADENOSON 0.4 MG/5ML IV SOLN
INTRAVENOUS | Status: AC
  Filled 2022-09-07: qty 5

## 2022-09-07 MED ORDER — TECHNETIUM TC 99M TETROFOSMIN IV KIT
30.0000 | PACK | Freq: Once | INTRAVENOUS | Status: AC | PRN
  Administered 2022-09-07: 30 via INTRAVENOUS

## 2022-09-07 MED ORDER — REGADENOSON 0.4 MG/5ML IV SOLN
0.4000 mg | Freq: Once | INTRAVENOUS | Status: AC
  Administered 2022-09-07: 0.4 mg via INTRAVENOUS

## 2022-09-07 MED ORDER — ONDANSETRON HCL 4 MG/2ML IJ SOLN
4.0000 mg | Freq: Once | INTRAMUSCULAR | Status: AC
  Administered 2022-09-07: 4 mg via INTRAVENOUS

## 2022-09-18 ENCOUNTER — Other Ambulatory Visit: Payer: Self-pay | Admitting: Family Medicine

## 2022-09-18 ENCOUNTER — Other Ambulatory Visit (HOSPITAL_COMMUNITY): Payer: Self-pay

## 2022-09-18 ENCOUNTER — Other Ambulatory Visit: Payer: Self-pay

## 2022-09-18 MED ORDER — GLUCOSE BLOOD VI STRP
ORAL_STRIP | 3 refills | Status: DC
  Filled 2022-09-18: qty 50, 50d supply, fill #0

## 2022-09-22 ENCOUNTER — Other Ambulatory Visit (HOSPITAL_COMMUNITY): Payer: Self-pay

## 2022-09-28 ENCOUNTER — Other Ambulatory Visit (HOSPITAL_COMMUNITY): Payer: Self-pay

## 2022-10-01 ENCOUNTER — Other Ambulatory Visit (HOSPITAL_COMMUNITY): Payer: Self-pay

## 2022-10-02 ENCOUNTER — Other Ambulatory Visit (HOSPITAL_COMMUNITY): Payer: Self-pay

## 2022-10-21 ENCOUNTER — Other Ambulatory Visit: Payer: Self-pay | Admitting: Family Medicine

## 2022-10-21 DIAGNOSIS — Z1239 Encounter for other screening for malignant neoplasm of breast: Secondary | ICD-10-CM

## 2022-10-27 ENCOUNTER — Other Ambulatory Visit (HOSPITAL_COMMUNITY): Payer: Self-pay

## 2022-10-28 ENCOUNTER — Other Ambulatory Visit (HOSPITAL_COMMUNITY): Payer: Self-pay

## 2022-10-28 MED ORDER — CITALOPRAM HYDROBROMIDE 20 MG PO TABS
20.0000 mg | ORAL_TABLET | Freq: Every day | ORAL | 3 refills | Status: DC
  Filled 2022-10-28: qty 30, 30d supply, fill #0
  Filled 2022-12-04: qty 30, 30d supply, fill #1
  Filled 2022-12-28: qty 30, 30d supply, fill #2
  Filled 2023-01-22: qty 30, 30d supply, fill #3

## 2022-11-12 ENCOUNTER — Other Ambulatory Visit: Payer: Self-pay | Admitting: Family Medicine

## 2022-11-12 DIAGNOSIS — Z1239 Encounter for other screening for malignant neoplasm of breast: Secondary | ICD-10-CM

## 2022-11-17 ENCOUNTER — Other Ambulatory Visit (HOSPITAL_COMMUNITY): Payer: Self-pay

## 2022-11-18 ENCOUNTER — Other Ambulatory Visit (HOSPITAL_COMMUNITY): Payer: Self-pay

## 2022-11-18 MED ORDER — ROSUVASTATIN CALCIUM 5 MG PO TABS
5.0000 mg | ORAL_TABLET | Freq: Every day | ORAL | 3 refills | Status: AC
  Filled 2022-11-18: qty 90, 90d supply, fill #0
  Filled 2023-03-01: qty 90, 90d supply, fill #1

## 2022-11-18 MED ORDER — METFORMIN HCL ER 500 MG PO TB24
500.0000 mg | ORAL_TABLET | Freq: Every evening | ORAL | 3 refills | Status: DC
  Filled 2022-11-18: qty 90, 90d supply, fill #0
  Filled 2023-03-01: qty 90, 90d supply, fill #1
  Filled 2023-05-27: qty 90, 90d supply, fill #2
  Filled 2023-09-03: qty 90, 90d supply, fill #3

## 2022-11-18 MED ORDER — ATROPINE SULFATE 1 % OP SOLN
1.0000 [drp] | Freq: Every day | OPHTHALMIC | 0 refills | Status: AC
  Filled 2022-11-18: qty 5, 100d supply, fill #0

## 2022-11-18 MED ORDER — PREDNISOLONE ACETATE 1 % OP SUSP
1.0000 [drp] | Freq: Four times a day (QID) | OPHTHALMIC | 2 refills | Status: AC
  Filled 2022-11-18: qty 10, 50d supply, fill #0

## 2022-12-02 ENCOUNTER — Other Ambulatory Visit (HOSPITAL_COMMUNITY): Payer: Self-pay

## 2022-12-04 ENCOUNTER — Other Ambulatory Visit (HOSPITAL_COMMUNITY): Payer: Self-pay

## 2022-12-10 ENCOUNTER — Other Ambulatory Visit (HOSPITAL_COMMUNITY): Payer: Self-pay | Admitting: Family Medicine

## 2022-12-10 ENCOUNTER — Other Ambulatory Visit (HOSPITAL_COMMUNITY): Payer: Self-pay

## 2022-12-18 ENCOUNTER — Other Ambulatory Visit: Payer: Self-pay | Admitting: Family Medicine

## 2022-12-18 ENCOUNTER — Other Ambulatory Visit (HOSPITAL_COMMUNITY): Payer: Self-pay

## 2022-12-18 DIAGNOSIS — N63 Unspecified lump in unspecified breast: Secondary | ICD-10-CM

## 2022-12-21 ENCOUNTER — Other Ambulatory Visit (HOSPITAL_COMMUNITY): Payer: Self-pay

## 2022-12-21 MED ORDER — ALBUTEROL SULFATE HFA 108 (90 BASE) MCG/ACT IN AERS
1.0000 | INHALATION_SPRAY | Freq: Four times a day (QID) | RESPIRATORY_TRACT | 2 refills | Status: DC
  Filled 2022-12-21: qty 6.7, 30d supply, fill #0
  Filled 2023-01-22: qty 6.7, 30d supply, fill #1

## 2023-01-22 ENCOUNTER — Other Ambulatory Visit (HOSPITAL_COMMUNITY): Payer: Self-pay

## 2023-01-27 ENCOUNTER — Other Ambulatory Visit (HOSPITAL_COMMUNITY): Payer: Self-pay

## 2023-01-27 MED ORDER — METOPROLOL SUCCINATE ER 25 MG PO TB24
25.0000 mg | ORAL_TABLET | Freq: Every evening | ORAL | 3 refills | Status: DC
  Filled 2023-01-27: qty 90, 90d supply, fill #0
  Filled 2023-04-26: qty 90, 90d supply, fill #1
  Filled 2023-07-28: qty 90, 90d supply, fill #2
  Filled 2023-10-19: qty 90, 90d supply, fill #3

## 2023-02-24 ENCOUNTER — Other Ambulatory Visit (HOSPITAL_COMMUNITY): Payer: Self-pay

## 2023-02-24 MED ORDER — PREDNISOLONE ACETATE 1 % OP SUSP
1.0000 [drp] | Freq: Four times a day (QID) | OPHTHALMIC | 2 refills | Status: AC
  Filled 2023-02-24: qty 5, 25d supply, fill #0

## 2023-02-24 MED ORDER — POLYMYXIN B-TRIMETHOPRIM 10000-0.1 UNIT/ML-% OP SOLN
1.0000 [drp] | Freq: Four times a day (QID) | OPHTHALMIC | 2 refills | Status: AC
  Filled 2023-02-24: qty 10, 50d supply, fill #0

## 2023-03-01 ENCOUNTER — Other Ambulatory Visit (HOSPITAL_COMMUNITY): Payer: Self-pay

## 2023-03-01 ENCOUNTER — Other Ambulatory Visit: Payer: Self-pay

## 2023-03-01 MED ORDER — CITALOPRAM HYDROBROMIDE 20 MG PO TABS
20.0000 mg | ORAL_TABLET | Freq: Every day | ORAL | 3 refills | Status: DC
  Filled 2023-03-01: qty 30, 30d supply, fill #0
  Filled 2023-03-29: qty 30, 30d supply, fill #1
  Filled 2023-04-26: qty 30, 30d supply, fill #2
  Filled 2023-05-27: qty 30, 30d supply, fill #3

## 2023-03-02 ENCOUNTER — Other Ambulatory Visit (HOSPITAL_COMMUNITY): Payer: Self-pay

## 2023-03-04 ENCOUNTER — Other Ambulatory Visit (HOSPITAL_COMMUNITY): Payer: Self-pay

## 2023-03-04 MED ORDER — POLYMYXIN B-TRIMETHOPRIM 10000-0.1 UNIT/ML-% OP SOLN
1.0000 [drp] | Freq: Four times a day (QID) | OPHTHALMIC | 0 refills | Status: AC
  Filled 2023-03-04 – 2023-03-31 (×3): qty 10, 50d supply, fill #0

## 2023-03-04 MED ORDER — PREDNISOLONE ACETATE 1 % OP SUSP
1.0000 [drp] | Freq: Four times a day (QID) | OPHTHALMIC | 0 refills | Status: AC
  Filled 2023-03-04 – 2023-03-31 (×4): qty 10, 50d supply, fill #0

## 2023-03-08 ENCOUNTER — Other Ambulatory Visit (HOSPITAL_COMMUNITY): Payer: Self-pay

## 2023-03-17 ENCOUNTER — Other Ambulatory Visit (HOSPITAL_COMMUNITY): Payer: Self-pay

## 2023-03-19 ENCOUNTER — Other Ambulatory Visit: Payer: Self-pay | Admitting: Family Medicine

## 2023-03-19 DIAGNOSIS — K760 Fatty (change of) liver, not elsewhere classified: Secondary | ICD-10-CM

## 2023-03-26 ENCOUNTER — Other Ambulatory Visit (HOSPITAL_COMMUNITY): Payer: Self-pay

## 2023-03-29 ENCOUNTER — Other Ambulatory Visit (HOSPITAL_COMMUNITY): Payer: Self-pay

## 2023-03-29 MED ORDER — LOSARTAN POTASSIUM 100 MG PO TABS
100.0000 mg | ORAL_TABLET | Freq: Every day | ORAL | 3 refills | Status: AC
  Filled 2023-03-29: qty 90, 90d supply, fill #0
  Filled 2023-07-07: qty 90, 90d supply, fill #1
  Filled 2023-10-04: qty 90, 90d supply, fill #2
  Filled 2023-12-30: qty 90, 90d supply, fill #3

## 2023-03-30 ENCOUNTER — Other Ambulatory Visit (HOSPITAL_COMMUNITY): Payer: Self-pay

## 2023-03-31 ENCOUNTER — Other Ambulatory Visit (HOSPITAL_COMMUNITY): Payer: Self-pay

## 2023-03-31 MED ORDER — AMOXICILLIN-POT CLAVULANATE 875-125 MG PO TABS
1.0000 | ORAL_TABLET | Freq: Two times a day (BID) | ORAL | 0 refills | Status: AC
  Filled 2023-03-31: qty 20, 10d supply, fill #0

## 2023-04-01 ENCOUNTER — Other Ambulatory Visit (HOSPITAL_COMMUNITY): Payer: Self-pay

## 2023-04-01 MED ORDER — ROSUVASTATIN CALCIUM 10 MG PO TABS
10.0000 mg | ORAL_TABLET | Freq: Every day | ORAL | 3 refills | Status: DC
  Filled 2023-04-01: qty 90, 90d supply, fill #0
  Filled 2023-07-07: qty 90, 90d supply, fill #1
  Filled 2023-10-04: qty 90, 90d supply, fill #2
  Filled 2023-12-30: qty 90, 90d supply, fill #3

## 2023-04-26 ENCOUNTER — Other Ambulatory Visit (HOSPITAL_COMMUNITY): Payer: Self-pay

## 2023-04-26 MED ORDER — AMLODIPINE BESYLATE 10 MG PO TABS
10.0000 mg | ORAL_TABLET | Freq: Every day | ORAL | 3 refills | Status: AC
  Filled 2023-04-26: qty 90, 90d supply, fill #0
  Filled 2023-07-28: qty 90, 90d supply, fill #1
  Filled 2023-10-19: qty 90, 90d supply, fill #2
  Filled 2024-02-08: qty 90, 90d supply, fill #3

## 2023-05-07 ENCOUNTER — Other Ambulatory Visit (HOSPITAL_COMMUNITY): Payer: Self-pay | Admitting: Family Medicine

## 2023-05-07 ENCOUNTER — Other Ambulatory Visit (HOSPITAL_COMMUNITY): Payer: Self-pay

## 2023-05-11 ENCOUNTER — Other Ambulatory Visit (HOSPITAL_COMMUNITY): Payer: Self-pay

## 2023-05-12 ENCOUNTER — Other Ambulatory Visit (HOSPITAL_COMMUNITY): Payer: Self-pay

## 2023-05-13 ENCOUNTER — Other Ambulatory Visit (HOSPITAL_COMMUNITY): Payer: Self-pay

## 2023-05-13 MED ORDER — ACCU-CHEK SOFTCLIX LANCETS MISC
Freq: Every day | 3 refills | Status: AC
  Filled 2023-05-13: qty 100, 90d supply, fill #0
  Filled 2024-03-06: qty 100, 90d supply, fill #1

## 2023-05-14 ENCOUNTER — Other Ambulatory Visit (HOSPITAL_COMMUNITY): Payer: Self-pay

## 2023-05-27 ENCOUNTER — Other Ambulatory Visit (HOSPITAL_COMMUNITY): Payer: Self-pay

## 2023-06-01 ENCOUNTER — Other Ambulatory Visit (HOSPITAL_COMMUNITY): Payer: Self-pay

## 2023-06-01 MED ORDER — POTASSIUM CHLORIDE ER 20 MEQ PO TBCR
20.0000 meq | EXTENDED_RELEASE_TABLET | Freq: Every morning | ORAL | 3 refills | Status: AC
  Filled 2023-06-01: qty 90, 90d supply, fill #0
  Filled 2023-09-03: qty 90, 90d supply, fill #1
  Filled 2023-11-30: qty 90, 90d supply, fill #2
  Filled 2024-03-06: qty 90, 90d supply, fill #3

## 2023-06-04 ENCOUNTER — Other Ambulatory Visit (HOSPITAL_COMMUNITY): Payer: Self-pay

## 2023-07-07 ENCOUNTER — Other Ambulatory Visit (HOSPITAL_COMMUNITY): Payer: Self-pay

## 2023-07-08 ENCOUNTER — Other Ambulatory Visit (HOSPITAL_COMMUNITY): Payer: Self-pay

## 2023-07-08 MED ORDER — CITALOPRAM HYDROBROMIDE 20 MG PO TABS
20.0000 mg | ORAL_TABLET | Freq: Every day | ORAL | 3 refills | Status: DC
  Filled 2023-07-08: qty 30, 30d supply, fill #0
  Filled 2023-09-07: qty 30, 30d supply, fill #1
  Filled 2023-10-04: qty 30, 30d supply, fill #2
  Filled 2023-11-08: qty 30, 30d supply, fill #3

## 2023-07-09 ENCOUNTER — Other Ambulatory Visit (HOSPITAL_COMMUNITY): Payer: Self-pay

## 2023-07-30 ENCOUNTER — Other Ambulatory Visit (HOSPITAL_COMMUNITY): Payer: Self-pay

## 2023-09-03 ENCOUNTER — Other Ambulatory Visit (HOSPITAL_COMMUNITY): Payer: Self-pay

## 2023-09-07 ENCOUNTER — Other Ambulatory Visit (HOSPITAL_COMMUNITY): Payer: Self-pay

## 2023-09-22 ENCOUNTER — Other Ambulatory Visit (HOSPITAL_COMMUNITY): Payer: Self-pay

## 2023-09-23 ENCOUNTER — Other Ambulatory Visit (HOSPITAL_COMMUNITY): Payer: Self-pay

## 2023-09-23 MED ORDER — OZEMPIC (0.25 OR 0.5 MG/DOSE) 2 MG/3ML ~~LOC~~ SOPN
0.2500 mg | PEN_INJECTOR | SUBCUTANEOUS | 3 refills | Status: AC
  Filled 2023-09-23: qty 3, 56d supply, fill #0

## 2023-09-24 ENCOUNTER — Other Ambulatory Visit (HOSPITAL_COMMUNITY): Payer: Self-pay

## 2023-10-04 ENCOUNTER — Other Ambulatory Visit (HOSPITAL_COMMUNITY): Payer: Self-pay

## 2023-11-30 ENCOUNTER — Other Ambulatory Visit (HOSPITAL_COMMUNITY): Payer: Self-pay

## 2023-11-30 MED ORDER — CITALOPRAM HYDROBROMIDE 20 MG PO TABS
20.0000 mg | ORAL_TABLET | Freq: Every day | ORAL | 3 refills | Status: AC
  Filled 2023-11-30 – 2023-12-01 (×2): qty 30, 30d supply, fill #0
  Filled 2024-01-13: qty 30, 30d supply, fill #1
  Filled 2024-02-08: qty 30, 30d supply, fill #2
  Filled 2024-03-06: qty 30, 30d supply, fill #3

## 2023-11-30 MED ORDER — METFORMIN HCL ER 500 MG PO TB24
500.0000 mg | ORAL_TABLET | Freq: Every evening | ORAL | 3 refills | Status: AC
  Filled 2023-11-30: qty 90, 90d supply, fill #0
  Filled 2024-03-06: qty 90, 90d supply, fill #1

## 2023-12-01 ENCOUNTER — Other Ambulatory Visit (HOSPITAL_COMMUNITY): Payer: Self-pay

## 2023-12-06 ENCOUNTER — Other Ambulatory Visit (HOSPITAL_COMMUNITY): Payer: Self-pay

## 2023-12-13 ENCOUNTER — Other Ambulatory Visit (HOSPITAL_COMMUNITY): Payer: Self-pay

## 2023-12-14 ENCOUNTER — Other Ambulatory Visit (HOSPITAL_COMMUNITY): Payer: Self-pay | Admitting: Family Medicine

## 2023-12-14 ENCOUNTER — Other Ambulatory Visit (HOSPITAL_COMMUNITY): Payer: Self-pay

## 2023-12-20 ENCOUNTER — Other Ambulatory Visit (HOSPITAL_COMMUNITY): Payer: Self-pay

## 2023-12-20 MED ORDER — OMEPRAZOLE 20 MG PO CPDR: 20.0000 mg | DELAYED_RELEASE_CAPSULE | Freq: Every day | ORAL | 3 refills | Status: AC

## 2023-12-20 MED ORDER — OMEPRAZOLE 20 MG PO CPDR
20.0000 mg | DELAYED_RELEASE_CAPSULE | Freq: Every day | ORAL | 3 refills | Status: AC
Start: 2023-12-20 — End: ?
  Filled 2023-12-20: qty 90, 90d supply, fill #0

## 2023-12-30 ENCOUNTER — Other Ambulatory Visit (HOSPITAL_COMMUNITY): Payer: Self-pay

## 2024-01-13 ENCOUNTER — Other Ambulatory Visit (HOSPITAL_COMMUNITY): Payer: Self-pay

## 2024-01-14 ENCOUNTER — Other Ambulatory Visit: Payer: Self-pay

## 2024-01-14 ENCOUNTER — Other Ambulatory Visit (HOSPITAL_COMMUNITY): Payer: Self-pay

## 2024-01-17 ENCOUNTER — Other Ambulatory Visit (HOSPITAL_COMMUNITY): Payer: Self-pay

## 2024-01-17 MED ORDER — METOPROLOL SUCCINATE ER 25 MG PO TB24
25.0000 mg | ORAL_TABLET | Freq: Every evening | ORAL | 3 refills | Status: AC
  Filled 2024-01-17: qty 90, 90d supply, fill #0

## 2024-02-08 ENCOUNTER — Other Ambulatory Visit (HOSPITAL_COMMUNITY): Payer: Self-pay

## 2024-02-14 ENCOUNTER — Ambulatory Visit (HOSPITAL_COMMUNITY): Payer: Self-pay

## 2024-02-14 ENCOUNTER — Other Ambulatory Visit (HOSPITAL_COMMUNITY): Payer: Self-pay

## 2024-02-14 ENCOUNTER — Ambulatory Visit (HOSPITAL_COMMUNITY)
Admission: EM | Admit: 2024-02-14 | Discharge: 2024-02-14 | Disposition: A | Discharge status: Home / Self Care | Attending: Student | Admitting: Student

## 2024-02-14 ENCOUNTER — Encounter (HOSPITAL_COMMUNITY): Payer: Self-pay | Admitting: Emergency Medicine

## 2024-02-14 ENCOUNTER — Ambulatory Visit (INDEPENDENT_AMBULATORY_CARE_PROVIDER_SITE_OTHER)

## 2024-02-14 DIAGNOSIS — J4521 Mild intermittent asthma with (acute) exacerbation: Secondary | ICD-10-CM | POA: Diagnosis not present

## 2024-02-14 MED ORDER — PREDNISONE 10 MG (21) PO TBPK
ORAL_TABLET | ORAL | 0 refills | Status: AC
  Filled 2024-02-14: qty 21, 6d supply, fill #0

## 2024-02-14 MED ORDER — ALBUTEROL SULFATE HFA 108 (90 BASE) MCG/ACT IN AERS
1.0000 | INHALATION_SPRAY | Freq: Four times a day (QID) | RESPIRATORY_TRACT | 0 refills | Status: AC | PRN
  Filled 2024-02-14: qty 6.7, 25d supply, fill #0

## 2024-02-14 NOTE — ED Provider Notes (Signed)
 " [facility]    CSN: [id number] Arrival date & time: 02/14/24  0932      History   Chief Complaint Chief Complaint  Patient presents with   Cough    HPI Kristi Bennett is a 73 y.o. female  Pt had cough that is productive with yellow sputum for 7 day, sputum is becoming thicker. Reports took Theraflu, mucinex, tylenol , soup, juices. States can't lay down bc gets to SOB. States now having some wheezing and is asthmatic.  For her asthma, she is prescribed albuterol  inhaler, which she has not required during present illness. Taking her zyrtec as directed.  Not UTD on influenza vaccine.   HPI  Past Medical History:  Diagnosis Date   Allergic rhinitis    Asthma    Depression    Diabetes mellitus without complication (HCC)    GERD (gastroesophageal reflux disease)    Heart murmur    Hyperlipidemia    Hypertension    Memory impairment    Obesity    Pneumothorax    Sinusitis     Patient Active Problem List   Diagnosis Date Noted   New-onset angina 04/05/2014   Chest tightness 07/24/2013   Hypertension    Hyperlipidemia    Diabetes mellitus without complication (HCC)    GERD (gastroesophageal reflux disease)    Obesity     Past Surgical History:  Procedure Laterality Date   ABDOMINAL HYSTERECTOMY     BREAST LUMPECTOMY     HERNIA REPAIR     LEFT HEART CATHETERIZATION WITH CORONARY ANGIOGRAM N/A 04/05/2014   Procedure: LEFT HEART CATHETERIZATION WITH CORONARY ANGIOGRAM;  Surgeon: Rober LOISE Chroman, MD;  Location: [facility];  Service: Cardiovascular;  Laterality: N/A;   LEG SURGERY     TUBAL LIGATION      OB History   No obstetric history on file.      Home Medications    Prior to Admission medications  Medication Sig Start Date End Date Taking? Authorizing Provider  albuterol  (VENTOLIN  HFA) 108 (90 Base) MCG/ACT inhaler Inhale 1-2 puffs into the lungs every 6 (six) hours as needed for wheezing or shortness of breath. 02/14/24  Yes Arlyss Leita BRAVO, PA-C  predniSONE  (STERAPRED UNI-PAK 21 TAB) 10 MG (21) TBPK tablet Take 6 tablets (60 mg total) by mouth daily for 1 day, THEN 5 tablets (50 mg total) daily for 1 day, THEN 4 tablets (40 mg total) daily for 1 day, THEN 3 tablets (30 mg total) daily for 1 day, THEN 2 tablets (20 mg total) daily for 1 day, THEN 1 tablet (10 mg total) daily for 1 day. 02/14/24 02/20/24 Yes Legaci Tarman E, PA-C  Accu-Chek Softclix Lancets lancets Use to check blood sugar once daily 05/13/23     acetaminophen -codeine  (TYLENOL  #3) 300-30 MG tablet Take 1 tablet by mouth every 6 (six) hours as needed for pain 08/07/21     amLODipine  (NORVASC ) 10 MG tablet TAKE 1 TABLET BY MOUTH EVERY NIGHT AT BEDTIME 02/12/20 02/11/21  Chroman Rober, MD  amLODipine  (NORVASC ) 10 MG tablet TAKE ONE TABLET BY MOUTH EVERY NIGHT AT BEDTIME 11/07/19 11/06/20  Chroman Rober, MD  amLODipine  (NORVASC ) 10 MG tablet Take 1 tablet (10 mg total) by mouth at bedtime. 04/26/23     amLODipine  (NORVASC ) 5 MG tablet Take 1 tablet (5 mg total) by mouth daily. 04/06/14   Chroman Rober, MD  amoxicillin  (AMOXIL ) 500 MG capsule Take 1 capsule (500 mg total) by mouth 3 (three) times daily  until gone 08/07/21     amoxicillin -clavulanate (AUGMENTIN ) 500-125 MG tablet Take 1 tablet (500 mg total) by mouth every 12 (twelve) hours. 02/13/21     amoxicillin -clavulanate (AUGMENTIN ) 875-125 MG tablet Take 1 tablet by mouth every 12 (twelve) hours for 10 days. 05/28/20     aspirin  EC 81 MG tablet Take 81 mg by mouth daily at 2 PM daily at 2 PM.    [provider]  atropine  1 % ophthalmic solution Place 1 drop into the left eye daily. 11/18/22     buPROPion (WELLBUTRIN) 100 MG tablet TAKE 1 TABLET BY MOUTH DAILY FOR 30 DAYS 03/28/20 03/28/21  Campbell Reynolds, NP  cetirizine (ZYRTEC) 10 MG tablet Take 10 mg by mouth daily.    [provider]  cholecalciferol 25 MCG (1000 UT) tablet Take 1 tablet (1,000 Units total) by mouth daily for Vitamin D Deficiency 05/29/22      citalopram  (CELEXA ) 10 MG tablet Take 10 mg by mouth daily.    [provider]  citalopram  (CELEXA ) 20 MG tablet Take 1 tablet (20 mg total) by mouth daily. 11/30/23     glucose blood (ACCU-CHEK AVIVA PLUS) test strip USE AS DIRECTED TWICE DAILY 02/20/21   Levern Hutching, MD  glucose blood test strip Use as directed for checking blood glucose. 09/18/22     ibuprofen  (ADVIL ) 600 MG tablet Take 1 tablet (600 mg total) by mouth every 6 to 8 hours as needed for pain 08/07/21     losartan  (COZAAR ) 100 MG tablet Take 100 mg by mouth daily at 2 PM daily at 2 PM.     [provider]  losartan  (COZAAR ) 100 MG tablet TAKE 1 TABLET BY MOUTH EVERY DAY 02/12/20 02/11/21  Levern Hutching, MD  losartan  (COZAAR ) 100 MG tablet Take 1 tablet (100 mg total) by mouth daily. 03/29/23     metFORMIN  (GLUCOPHAGE ) 1000 MG tablet Take 1,000 mg by mouth 2 (two) times daily with a meal.    [provider]  metFORMIN  (GLUCOPHAGE -XR) 500 MG 24 hr tablet Take 500 mg by mouth daily. 07/13/19   [provider]  metFORMIN  (GLUCOPHAGE -XR) 500 MG 24 hr tablet Take 1 tablet (500 mg total) by mouth once a day with evening meal 11/30/23     metoprolol  succinate (TOPROL -XL) 25 MG 24 hr tablet Take 1 tablet (25 mg total) by mouth at bedtime. 01/17/24     montelukast  (SINGULAIR ) 10 MG tablet Take 10 mg by mouth at bedtime as needed (allergies).     [provider]  naproxen sodium (ALEVE) 220 MG tablet Take 440 mg by mouth daily as needed.    [provider]  nitroGLYCERIN  (NITROSTAT ) 0.4 MG SL tablet use as directed 08/27/22   Levern Hutching, MD  omeprazole  (PRILOSEC) 20 MG capsule Take 1 capsule (20 mg total) by mouth daily 30 - 60 minutes before eating or drinking. 12/20/23     omeprazole  (PRILOSEC) 20 MG capsule Take 1 capsule (20 mg total) by mouth daily 30 - 60 minutes before eating or drinking. 12/20/23     polyethylene glycol-electrolytes (NULYTELY) 420 g solution Take as directed 04/24/20      Potassium Chloride  ER 20 MEQ TBCR Take 1 tablet (20 mEq total) by mouth in the morning. 06/01/23     potassium chloride  SA (KLOR-CON  M) 20 MEQ tablet Take 1 tablet (20 mEq total) by mouth daily. 05/24/22   Zammit, Joseph, MD  prednisoLONE  acetate (PRED FORTE ) 1 % ophthalmic suspension Place 1 drop  into the left eye every day at bedtime 09/20/20     prednisoLONE  acetate (PRED FORTE ) 1 % ophthalmic suspension Place 1 drop into the left eye 4 (four) times daily. 11/18/22     prednisoLONE  acetate (PRED FORTE ) 1 % ophthalmic suspension Place 1 drop into the left eye 4 (four) times daily starting after surgery. 02/23/23     prednisoLONE  acetate (PRED FORTE ) 1 % ophthalmic suspension Place 1 drop into the right eye 4 (four) times daily. 03/04/23     rosuvastatin  (CRESTOR ) 10 MG tablet Take 1 tablet (10 mg total) by mouth at bedtime. 04/01/23     rosuvastatin  (CRESTOR ) 5 MG tablet Take 1 tablet (5 mg total) by mouth daily at 6 PM. 04/06/14   Levern Hutching, MD  rosuvastatin  (CRESTOR ) 5 MG tablet TAKE ONE (1) TABLET BY MOUTH EVERY DAY AT 6:00PM 01/03/20 01/02/21  Levern Hutching, MD  rosuvastatin  (CRESTOR ) 5 MG tablet Take 1 tablet (5 mg total) by mouth daily at 6 PM. 11/18/22     Semaglutide ,0.25 or 0.5MG /DOS, (OZEMPIC , 0.25 OR 0.5 MG/DOSE,) 2 MG/1.5ML SOPN Inject 0.25 mg into the skin once a week rotating injection sites: abdomen, thighs, or upper arm 04/10/21     Semaglutide ,0.25 or 0.5MG /DOS, (OZEMPIC , 0.25 OR 0.5 MG/DOSE,) 2 MG/3ML SOPN Inject 0.25 mg into the skin once a week on the same day rotating injection sites, abdomen, thighs, upper arm . 04/17/21     Semaglutide ,0.25 or 0.5MG /DOS, (OZEMPIC , 0.25 OR 0.5 MG/DOSE,) 2 MG/3ML SOPN Inject 0.25 mg into the skin once a week (same day) rotating injection sites, abdomen, thighs, upper arm. 09/23/23     tizanidine  (ZANAFLEX ) 2 MG capsule Take 1 capsule (2 mg total) by mouth 3 (three) times daily. 11/01/21   Enedelia Dorna HERO, FNP  traMADol  (ULTRAM ) 50 MG tablet Take 1  tablet (50 mg total) by mouth every 6 (six) hours as needed. 05/24/22   Zammit, Joseph, MD  trimethoprim -polymyxin b  (POLYTRIM ) ophthalmic solution Place 1 drop into the left eye 4 (four) times daily starting 2 days before surgery. 02/23/23     trimethoprim -polymyxin b  (POLYTRIM ) ophthalmic solution Place 1 drop into the right eye 4 (four) times daily. 03/04/23       Family History Family History  Problem Relation Age of Onset   Hypertension Other    Coronary artery disease Other     Social History Social History[1]   Allergies   Atorvastatin , Avelox [moxifloxacin], Excedrin extra strength [aspirin -acetaminophen -caffeine], and Irbesartan   Review of Systems Review of Systems  Constitutional:  Negative for appetite change, chills and fever.  HENT:  Positive for congestion. Negative for ear pain, rhinorrhea, sinus pressure, sinus pain and sore throat.   Eyes:  Negative for redness and visual disturbance.  Respiratory:  Positive for cough. Negative for chest tightness, shortness of breath and wheezing.   Cardiovascular:  Negative for chest pain and palpitations.  Gastrointestinal:  Negative for abdominal pain, constipation, diarrhea, nausea and vomiting.  Genitourinary:  Negative for dysuria, frequency and urgency.  Musculoskeletal:  Negative for myalgias.  Neurological:  Negative for dizziness, weakness and headaches.  Psychiatric/Behavioral:  Negative for confusion.   All other systems reviewed and are negative.    Physical Exam Triage Vital Signs ED Triage Vitals  Encounter Vitals Group     BP 02/14/24 1127 (!) 151/77     Girls Systolic BP Percentile --      Girls Diastolic BP Percentile --      Boys Systolic BP Percentile --  Boys Diastolic BP Percentile --      Pulse Rate 02/14/24 1127 72     Resp 02/14/24 1127 20     Temp 02/14/24 1127 98 F (36.7 C)     Temp Source 02/14/24 1127 Oral     SpO2 02/14/24 1127 96 %     Weight --      Height --      Head  Circumference --      Peak Flow --      Pain Score 02/14/24 1126 0     Pain Loc --      Pain Education --      Exclude from Growth Chart --    No data found.  Updated Vital Signs BP (!) 151/77 (BP Location: Left Arm)   Pulse 72   Temp 98 F (36.7 C) (Oral)   Resp 20   SpO2 96%   Visual Acuity Right Eye Distance:   Left Eye Distance:   Bilateral Distance:    Right Eye Near:   Left Eye Near:    Bilateral Near:     Physical Exam Vitals reviewed.  Constitutional:      General: She is not in acute distress.    Appearance: Normal appearance. She is not ill-appearing.  HENT:     Head: Normocephalic and atraumatic.     Right Ear: Tympanic membrane, ear canal and external ear normal. No tenderness. No middle ear effusion. There is no impacted cerumen. Tympanic membrane is not perforated, erythematous, retracted or bulging.     Left Ear: Tympanic membrane, ear canal and external ear normal. No tenderness.  No middle ear effusion. There is no impacted cerumen. Tympanic membrane is not perforated, erythematous, retracted or bulging.     Nose: Nose normal. No congestion.     Mouth/Throat:     Mouth: Mucous membranes are moist.     Pharynx: Uvula midline. No oropharyngeal exudate or posterior oropharyngeal erythema.     Tonsils: No tonsillar exudate. 0 on the right. 0 on the left.     Comments: Posterior oropharyngeal cobblestoning Eyes:     Extraocular Movements: Extraocular movements intact.     Pupils: Pupils are equal, round, and reactive to light.  Cardiovascular:     Rate and Rhythm: Normal rate and regular rhythm.     Heart sounds: Normal heart sounds.  Pulmonary:     Effort: Pulmonary effort is normal.     Breath sounds: Normal breath sounds. No decreased breath sounds, wheezing, rhonchi or rales.     Comments: Frequent hacking cough Abdominal:     Palpations: Abdomen is soft.     Tenderness: There is no abdominal tenderness. There is no guarding or rebound.   Lymphadenopathy:     Cervical: No cervical adenopathy.     Right cervical: No superficial, deep or posterior cervical adenopathy.    Left cervical: No superficial, deep or posterior cervical adenopathy.  Skin:    Comments: No rash   Neurological:     General: No focal deficit present.     Mental Status: She is alert and oriented to person, place, and time.  Psychiatric:        Mood and Affect: Mood normal.        Behavior: Behavior normal.        Thought Content: Thought content normal.        Judgment: Judgment normal.      UC Treatments / Results  Labs (all labs ordered are listed, but  only abnormal results are displayed) Labs Reviewed - No data to display  EKG   Radiology No results found.  Procedures Procedures (including critical care time)  Medications Ordered in UC Medications - No data to display  Initial Impression / Assessment and Plan / UC Course  I have reviewed the triage vital signs and the nursing notes.  Pertinent labs & imaging results that were available during my care of the patient were reviewed by me and considered in my medical decision making (see chart for details).     Patient is a pleasant 73 y.o. female presenting with asthma exacerbation due to viral URI. The patient is afebrile and nontachycardic.  Antipyretic has not been administered today.  On exam, lungs are clear to auscultation.  Did not check a COVID or influenza test due to duration of symptoms.  CXR: Aortic atherosclerosis. No acute abnormality of the cardiac and mediastinal silhouettes.  Prednisone  sent. Refilled albuterol  inhaler.  Return precautions as below.  Coding Level 4 for acute illness with systemic symptoms, and prescription drug management   Final Clinical Impressions(s) / UC Diagnoses   Final diagnoses:  Mild intermittent asthma with (acute) exacerbation     Discharge Instructions      - Your chest x-ray looks normal to me.  I did not see any pneumonia  or bacterial infection.  A radiologist will read this officially in the next 1 to 2 hours, and I will call you if they see something that I did not. -Prednisone  taper for asthma/cough. I recommend taking this in the morning as it could give you energy.  Avoid NSAIDs like ibuprofen  and alleve while taking this medication as they can increase your risk of stomach upset and even GI bleeding when in combination with a steroid. You can continue tylenol  (acetaminophen ) up to 1000mg  3x daily. -Albuterol  inhaler as needed for cough, wheezing, shortness of breath, 1 to 2 puffs every 6 hours as needed. -Your cough should slowly get better instead of worse. If you develop a cough productive of dark or red sputum, new shortness of breath, new chest tightness, new fevers, etc - seek additional care.      ED Prescriptions     Medication Sig Dispense Auth. Provider   predniSONE  (STERAPRED UNI-PAK 21 TAB) 10 MG (21) TBPK tablet Take 6 tablets (60 mg total) by mouth daily for 1 day, THEN 5 tablets (50 mg total) daily for 1 day, THEN 4 tablets (40 mg total) daily for 1 day, THEN 3 tablets (30 mg total) daily for 1 day, THEN 2 tablets (20 mg total) daily for 1 day, THEN 1 tablet (10 mg total) daily for 1 day. 21 tablet Jameelah Watts E, PA-C   albuterol  (VENTOLIN  HFA) 108 (90 Base) MCG/ACT inhaler Inhale 1-2 puffs into the lungs every 6 (six) hours as needed for wheezing or shortness of breath. 6.7 g Georgiana Spillane E, PA-C      PDMP not reviewed this encounter.     [1]  Social History Tobacco Use   Smoking status: Never   Smokeless tobacco: Never  Substance Use Topics   Alcohol use: Yes    Comment: glass of wine once or twice a month   Drug use: No     Arlyss Leita BRAVO, PA-C 02/14/24 1419  "

## 2024-02-14 NOTE — ED Triage Notes (Signed)
 Pt had cough that is productive with yellow sputum for 7 days. Reports took Theraflu, mucinex, tylenol , soup, juices. States can't lay down bc gets to SOB. States now having some wheezing and is asthmatic.

## 2024-02-14 NOTE — Discharge Instructions (Addendum)
-   Your chest x-ray looks normal to me.  I did not see any pneumonia or bacterial infection.  A radiologist will read this officially in the next 1 to 2 hours, and I will call you if they see something that I did not. -Prednisone  taper for asthma/cough. I recommend taking this in the morning as it could give you energy.  Avoid NSAIDs like ibuprofen  and alleve while taking this medication as they can increase your risk of stomach upset and even GI bleeding when in combination with a steroid. You can continue tylenol  (acetaminophen ) up to 1000mg  3x daily. -Albuterol  inhaler as needed for cough, wheezing, shortness of breath, 1 to 2 puffs every 6 hours as needed. -Your cough should slowly get better instead of worse. If you develop a cough productive of dark or red sputum, new shortness of breath, new chest tightness, new fevers, etc - seek additional care.

## 2024-02-22 ENCOUNTER — Other Ambulatory Visit (HOSPITAL_COMMUNITY): Payer: Self-pay

## 2024-02-22 MED ORDER — ROSUVASTATIN CALCIUM 10 MG PO TABS
10.0000 mg | ORAL_TABLET | Freq: Every day | ORAL | 3 refills | Status: AC
  Filled 2024-02-22: qty 90, 90d supply, fill #0

## 2024-03-06 ENCOUNTER — Other Ambulatory Visit: Payer: Self-pay

## 2024-03-06 ENCOUNTER — Other Ambulatory Visit (HOSPITAL_COMMUNITY): Payer: Self-pay

## 2024-03-08 ENCOUNTER — Other Ambulatory Visit (HOSPITAL_BASED_OUTPATIENT_CLINIC_OR_DEPARTMENT_OTHER): Payer: Self-pay

## 2024-03-13 ENCOUNTER — Other Ambulatory Visit (HOSPITAL_COMMUNITY): Payer: Self-pay

## 2024-03-13 MED ORDER — ACCU-CHEK AVIVA PLUS VI STRP
ORAL_STRIP | 2 refills | Status: AC
  Filled 2024-03-13: qty 100, 100d supply, fill #0

## 2024-03-13 MED ORDER — ACCU-CHEK AVIVA PLUS VI STRP
ORAL_STRIP | 3 refills | Status: AC
  Filled 2024-03-13: qty 100, 100d supply, fill #0
